# Patient Record
Sex: Female | Born: 1970
Health system: Southern US, Community
[De-identification: ages and names within clinical notes are randomized; demographics above are authoritative.]

## PROBLEM LIST (undated history)

## (undated) DIAGNOSIS — E559 Vitamin D deficiency, unspecified: Secondary | ICD-10-CM

## (undated) DIAGNOSIS — B977 Papillomavirus as the cause of diseases classified elsewhere: Secondary | ICD-10-CM

## (undated) DIAGNOSIS — R87619 Unspecified abnormal cytological findings in specimens from cervix uteri: Secondary | ICD-10-CM

## (undated) DIAGNOSIS — F329 Major depressive disorder, single episode, unspecified: Secondary | ICD-10-CM

## (undated) DIAGNOSIS — E119 Type 2 diabetes mellitus without complications: Secondary | ICD-10-CM

## (undated) DIAGNOSIS — E7800 Pure hypercholesterolemia, unspecified: Secondary | ICD-10-CM

## (undated) DIAGNOSIS — F419 Anxiety disorder, unspecified: Secondary | ICD-10-CM

## (undated) DIAGNOSIS — E039 Hypothyroidism, unspecified: Secondary | ICD-10-CM

## (undated) DIAGNOSIS — F32A Depression, unspecified: Secondary | ICD-10-CM

## (undated) HISTORY — DX: Hypothyroidism, unspecified: E03.9

## (undated) HISTORY — DX: Papillomavirus as the cause of diseases classified elsewhere: B97.7

## (undated) HISTORY — DX: Type 2 diabetes mellitus without complications: E11.9

## (undated) HISTORY — DX: Major depressive disorder, single episode, unspecified: F32.9

## (undated) HISTORY — DX: Unspecified abnormal cytological findings in specimens from cervix uteri: R87.619

## (undated) HISTORY — DX: Vitamin D deficiency, unspecified: E55.9

## (undated) HISTORY — DX: Anxiety disorder, unspecified: F41.9

## (undated) HISTORY — DX: Pure hypercholesterolemia, unspecified: E78.00

## (undated) HISTORY — DX: Depression, unspecified: F32.A

---

## 2003-06-25 HISTORY — PX: COLPOSCOPY: SHX161

## 2013-09-13 ENCOUNTER — Encounter: Payer: Self-pay | Admitting: Gynecology

## 2013-10-08 ENCOUNTER — Encounter: Payer: Self-pay | Admitting: Gynecology

## 2013-10-19 ENCOUNTER — Ambulatory Visit (INDEPENDENT_AMBULATORY_CARE_PROVIDER_SITE_OTHER): Payer: BC Managed Care – PPO | Admitting: Gynecology

## 2013-10-19 ENCOUNTER — Encounter: Payer: Self-pay | Admitting: Gynecology

## 2013-10-19 VITALS — BP 110/70 | HR 76 | Resp 16 | Ht 66.75 in | Wt 147.0 lb

## 2013-10-19 DIAGNOSIS — Z01419 Encounter for gynecological examination (general) (routine) without abnormal findings: Secondary | ICD-10-CM

## 2013-10-19 DIAGNOSIS — F32A Depression, unspecified: Secondary | ICD-10-CM | POA: Insufficient documentation

## 2013-10-19 DIAGNOSIS — E119 Type 2 diabetes mellitus without complications: Secondary | ICD-10-CM | POA: Insufficient documentation

## 2013-10-19 DIAGNOSIS — Z309 Encounter for contraceptive management, unspecified: Secondary | ICD-10-CM

## 2013-10-19 DIAGNOSIS — Z124 Encounter for screening for malignant neoplasm of cervix: Secondary | ICD-10-CM

## 2013-10-19 DIAGNOSIS — Z0000 Encounter for general adult medical examination without abnormal findings: Secondary | ICD-10-CM

## 2013-10-19 DIAGNOSIS — F419 Anxiety disorder, unspecified: Secondary | ICD-10-CM | POA: Insufficient documentation

## 2013-10-19 DIAGNOSIS — F329 Major depressive disorder, single episode, unspecified: Secondary | ICD-10-CM | POA: Insufficient documentation

## 2013-10-19 LAB — POCT URINALYSIS DIPSTICK
BILIRUBIN UA: NEGATIVE
Blood, UA: NEGATIVE
GLUCOSE UA: NEGATIVE
KETONES UA: NEGATIVE
LEUKOCYTES UA: NEGATIVE
NITRITE UA: NEGATIVE
Protein, UA: NEGATIVE
Urobilinogen, UA: NEGATIVE
pH, UA: 5

## 2013-10-19 MED ORDER — NORETHIN ACE-ETH ESTRAD-FE 1-20 MG-MCG PO TABS: 1.0000 | ORAL_TABLET | Freq: Every day | ORAL | Status: DC

## 2013-10-19 NOTE — Patient Instructions (Signed)

## 2013-10-19 NOTE — Progress Notes (Signed)
43 y.o. single Caucasian female   G0P0000 here for annual exam. Pt is currently sexually active. Pt is on generic ovcon 35, reports that cycles are very light, or nonexistent, she is concerned that she doesn't bleed, and fearful re pregnancy.  Pt is type I diabetic, 30y, HbAic 7.1.  Pt does not report issues with yeast infections, denies retinopathy, nephropathy or CVD. No children by choice.  Patient's last menstrual period was 10/05/2013.          Sexually active: yes  The current method of family planning is OCP (estrogen/progesterone).    Exercising: yes  walking & light weights Last pap: 4/14 Alcohol: 6 a week Tobacco: none BSE: not done Mammo: 5/14  Per patient normal    Health Maintenance  Topic Date Due  . Pap Smear  04/12/1989  . Tetanus/tdap  04/12/1990  . Influenza Vaccine  01/22/2014    Family History  Problem Relation Age of Onset  . Thyroid disease Mother   . Stroke Maternal Grandmother     There are no active problems to display for this patient.   Past Medical History  Diagnosis Date  . Abnormal Pap smear of cervix   . Anxiety   . Depression   . Diabetes mellitus without complication   . HPV (human papilloma virus) infection     Past Surgical History  Procedure Laterality Date  . Colposcopy      Allergies: Review of patient's allergies indicates no known allergies.  Current Outpatient Prescriptions  Medication Sig Dispense Refill  . BALZIVA 0.4-35 MG-MCG tablet daily.       Marland Kitchen. LEVEMIR FLEXTOUCH 100 UNIT/ML Pen       . NOVOLOG FLEXPEN 100 UNIT/ML FlexPen       . ONE TOUCH ULTRA TEST test strip       . sertraline (ZOLOFT) 100 MG tablet Takes 200mg  daily      . traZODone (DESYREL) 50 MG tablet as needed.       No current facility-administered medications for this visit.    ROS: Pertinent items are noted in HPI.  Exam:    BP 110/70  Pulse 76  Resp 16  Ht 5' 6.75" (1.695 m)  Wt 147 lb (66.679 kg)  BMI 23.21 kg/m2  LMP 10/05/2013 Weight  change: @WEIGHTCHANGE @ Last 3 height recordings:  Ht Readings from Last 3 Encounters:  10/19/13 5' 6.75" (1.695 m)   General appearance: alert, cooperative and appears stated age Head: Normocephalic, without obvious abnormality, atraumatic Neck: no adenopathy, no carotid bruit, no JVD, supple, symmetrical, trachea midline and thyroid not enlarged, symmetric, no tenderness/mass/nodules Lungs: clear to auscultation bilaterally Breasts: normal appearance, no masses or tenderness Heart: regular rate and rhythm, S1, S2 normal, no murmur, click, rub or gallop Abdomen: soft, non-tender; bowel sounds normal; no masses,  no organomegaly Extremities: extremities normal, atraumatic, no cyanosis or edema Skin: Skin color, texture, turgor normal. No rashes or lesions Lymph nodes: Cervical, supraclavicular, and axillary nodes normal. no inguinal nodes palpated Neurologic: Grossly normal   Pelvic: External genitalia:  normal escutcheon              Urethra: normal appearing urethra with no masses, tenderness or lesions              Bartholins and Skenes: Bartholin's, Urethra, Skene's normal                 Vagina: normal appearing vagina with normal color and discharge, no lesions  Cervix: normal appearance              Pap taken: yes        Bimanual Exam:  Uterus:  uterus is normal size, shape, consistency and nontender                                      Adnexa:    normal adnexa in size, nontender and no masses                                      Rectovaginal: Confirms                                      Anus:  normal sphincter tone, no lesions  1. Routine gynecological examination Well woman mammogram counseled on breast self exam, mammography screening, use and side effects of OCP's, adequate intake of calcium and vitamin D, diet and exercise  2. Laboratory examination ordered as part of a routine general medical examination  - POCT Urinalysis Dipstick  3. Diabetes mellitus  without complication Recommend decreasing estrogen in ocp due to long standing diabetes and overall risk of CVD, pt agreeable  4. Screening for cervical cancer Reviewed change in guidelines and course of HPV infection - Pap Test with HP (IPS)  5. Contraception management loestrin 1/20 #3 x3 Pt aware that amenorrhea on ocp can be normal       return annually or prn   An After Visit Summary was printed and given to the patient.

## 2013-10-21 LAB — IPS PAP TEST WITH HPV

## 2013-10-28 ENCOUNTER — Other Ambulatory Visit: Payer: Self-pay | Admitting: Gynecology

## 2013-10-28 DIAGNOSIS — Z1231 Encounter for screening mammogram for malignant neoplasm of breast: Secondary | ICD-10-CM

## 2013-11-19 ENCOUNTER — Telehealth: Payer: Self-pay | Admitting: Gynecology

## 2013-11-19 NOTE — Telephone Encounter (Signed)
Left msg regarding records are available to pick up. I will hold until 11/26/2013.

## 2013-11-22 ENCOUNTER — Ambulatory Visit (HOSPITAL_COMMUNITY): Payer: BC Managed Care – PPO

## 2013-11-22 ENCOUNTER — Ambulatory Visit (HOSPITAL_COMMUNITY)
Admission: RE | Admit: 2013-11-22 | Discharge: 2013-11-22 | Disposition: A | Discharge status: Home / Self Care | Payer: BC Managed Care – PPO | Source: Ambulatory Visit | Attending: Gynecology | Admitting: Gynecology

## 2013-11-22 DIAGNOSIS — Z1231 Encounter for screening mammogram for malignant neoplasm of breast: Secondary | ICD-10-CM | POA: Insufficient documentation

## 2014-05-24 ENCOUNTER — Telehealth: Payer: Self-pay

## 2014-05-24 NOTE — Telephone Encounter (Signed)
lmtcb to reschedule AEX with Dr. Lathrop 

## 2014-10-05 ENCOUNTER — Other Ambulatory Visit: Payer: Self-pay | Admitting: Certified Nurse Midwife

## 2014-10-05 MED ORDER — NORETHIN ACE-ETH ESTRAD-FE 1-20 MG-MCG PO TABS: 1.0000 | ORAL_TABLET | Freq: Every day | ORAL | Status: DC

## 2014-10-05 NOTE — Telephone Encounter (Signed)
Pt requests birth control refill until appt 10/27/14.  [facility]

## 2014-10-05 NOTE — Telephone Encounter (Signed)
Medication refill request: OCP Last AEX:  10/19/13 TL Next AEX: 10/26/14 DL Last MMG (if hormonal medication request): 11/23/13 BIRADS1:Neg Refill authorized: 10/19/13 #3packs/3R. Today #1pack/0R?

## 2014-10-21 ENCOUNTER — Ambulatory Visit: Payer: BC Managed Care – PPO | Admitting: Gynecology

## 2014-10-26 ENCOUNTER — Ambulatory Visit (INDEPENDENT_AMBULATORY_CARE_PROVIDER_SITE_OTHER): Payer: BLUE CROSS/BLUE SHIELD | Admitting: Certified Nurse Midwife

## 2014-10-26 ENCOUNTER — Encounter: Payer: Self-pay | Admitting: Certified Nurse Midwife

## 2014-10-26 VITALS — BP 104/68 | HR 70 | Resp 20 | Ht 66.5 in | Wt 155.0 lb

## 2014-10-26 DIAGNOSIS — Z01419 Encounter for gynecological examination (general) (routine) without abnormal findings: Secondary | ICD-10-CM

## 2014-10-26 DIAGNOSIS — Z3009 Encounter for other general counseling and advice on contraception: Secondary | ICD-10-CM | POA: Diagnosis not present

## 2014-10-26 MED ORDER — NORETHIN ACE-ETH ESTRAD-FE 1-20 MG-MCG PO TABS: 1.0000 | ORAL_TABLET | Freq: Every day | ORAL | Status: DC

## 2014-10-26 NOTE — Patient Instructions (Signed)

## 2014-10-26 NOTE — Progress Notes (Signed)
44 y.o. G0P0000 Single  Caucasian Fe here for annual exam. Periods normal, no issues. Contraception OCP working well. No STD concerns or screening. Sees Endocrine for Diabetes management, stable at present. No health issues today.  Patient's last menstrual period was 10/03/2014.          Sexually active: Yes.    The current method of family planning is OCP (estrogen/progesterone).    Exercising: Yes.    walking & weights Smoker:  no  Health Maintenance: Pap:  10-19-13 neg HPV HR neg History of abnormal pap with colposcopy with repeat pap smears normal MMG:  11-22-13 category d density,birads 1:neg Colonoscopy:  none BMD:   none TDaP:  09-06-14 Labs: none Self breast exam: done monthly   reports that she has never smoked. She does not have any smokeless tobacco history on file. She reports that she drinks about 1.2 - 1.8 oz of alcohol per week. She reports that she does not use illicit drugs.  Past Medical History  Diagnosis Date  . Abnormal Pap smear of cervix   . Anxiety   . Depression   . Diabetes mellitus without complication   . HPV (human papilloma virus) infection     Past Surgical History  Procedure Laterality Date  . Colposcopy  2005    Current Outpatient Prescriptions  Medication Sig Dispense Refill  . busPIRone (BUSPAR) 30 MG tablet daily.  0  . FLUoxetine (PROZAC) 40 MG capsule Take 80 mg by mouth daily.  0  . LEVEMIR FLEXTOUCH 100 UNIT/ML Pen     . norethindrone-ethinyl estradiol (JUNEL FE,GILDESS FE,LOESTRIN FE) 1-20 MG-MCG tablet Take 1 tablet by mouth daily. 1 Package 0  . NOVOLOG FLEXPEN 100 UNIT/ML FlexPen     . ONE TOUCH ULTRA TEST test strip     . BD PEN NEEDLE NANO U/F 32G X 4 MM MISC   10   No current facility-administered medications for this visit.    Family History  Problem Relation Age of Onset  . Thyroid disease Mother   . Stroke Maternal Grandmother     ROS:  Pertinent items are noted in HPI.  Otherwise, a comprehensive ROS was  negative.  Exam:   BP 104/68 mmHg  Pulse 70  Resp 20  Ht 5' 6.5" (1.689 m)  Wt 155 lb (70.308 kg)  BMI 24.65 kg/m2  LMP 10/03/2014 Height: 5' 6.5" (168.9 cm) Ht Readings from Last 3 Encounters:  10/26/14 5' 6.5" (1.689 m)  10/19/13 5' 6.75" (1.695 m)    General appearance: alert, cooperative and appears stated age Head: Normocephalic, without obvious abnormality, atraumatic Neck: no adenopathy, supple, symmetrical, trachea midline and thyroid normal to inspection and palpation Lungs: clear to auscultation bilaterally Breasts: normal appearance, no masses or tenderness, No nipple retraction or dimpling, No nipple discharge or bleeding, No axillary or supraclavicular adenopathy Heart: regular rate and rhythm Abdomen: soft, non-tender; no masses,  no organomegaly Extremities: extremities normal, atraumatic, no cyanosis or edema Skin: Skin color, texture, turgor normal. No rashes or lesions Lymph nodes: Cervical, supraclavicular, and axillary nodes normal. No abnormal inguinal nodes palpated Neurologic: Grossly normal   Pelvic: External genitalia:  no lesions              Urethra:  normal appearing urethra with no masses, tenderness or lesions              Bartholin's and Skene's: normal                 Vagina: normal  appearing vagina with normal color and discharge, no lesions              Cervix: normal,non tender, no lesions              Pap taken: No. Bimanual Exam:  Uterus:  normal size, contour, position, consistency, mobility, non-tender              Adnexa: normal adnexa and no mass, fullness, tenderness               Rectovaginal: Confirms               Anus:  normal sphincter tone, no lesions  Chaperone present: Yes  A:  Well Woman with normal exam  Contraception OCP desired  Insulin dependant diabetic juvenile onset, stable with Endocrine management.  P:   Reviewed health and wellness pertinent to exam  Rx Junel 1/20 Fe see order  Continue MD follow up as  indicated  Pap smear not taken today   counseled on breast self exam, mammography screening, use and side effects of OCP's, adequate intake of calcium and vitamin D, diet and exercise  return annually or prn  An After Visit Summary was printed and given to the patient.

## 2014-10-28 ENCOUNTER — Other Ambulatory Visit: Payer: Self-pay | Admitting: Certified Nurse Midwife

## 2014-10-28 NOTE — Telephone Encounter (Signed)
Medication refill request: Junel  Last AEX: 10/26/14 DL Next AEX: 6/5/785/9/17 DL Last MMG (if hormonal medication request): 11/23/13 BIRADS!:neg Refill authorized: 10/26/14 #3packs/ 4 Refills to [facility]

## 2014-10-29 NOTE — Progress Notes (Signed)
Reviewed personally.  M. Suzanne Lysander Calixte, MD.  

## 2014-10-31 ENCOUNTER — Other Ambulatory Visit: Payer: Self-pay | Admitting: Certified Nurse Midwife

## 2014-10-31 DIAGNOSIS — Z1231 Encounter for screening mammogram for malignant neoplasm of breast: Secondary | ICD-10-CM

## 2014-11-30 ENCOUNTER — Ambulatory Visit (HOSPITAL_COMMUNITY): Payer: Self-pay

## 2014-12-06 ENCOUNTER — Ambulatory Visit (HOSPITAL_COMMUNITY)
Admission: RE | Admit: 2014-12-06 | Discharge: 2014-12-06 | Disposition: A | Discharge status: Home / Self Care | Payer: BLUE CROSS/BLUE SHIELD | Source: Ambulatory Visit | Attending: Certified Nurse Midwife | Admitting: Certified Nurse Midwife

## 2014-12-06 DIAGNOSIS — Z1231 Encounter for screening mammogram for malignant neoplasm of breast: Secondary | ICD-10-CM | POA: Insufficient documentation

## 2015-10-30 ENCOUNTER — Other Ambulatory Visit: Payer: Self-pay

## 2015-10-30 DIAGNOSIS — Z1231 Encounter for screening mammogram for malignant neoplasm of breast: Secondary | ICD-10-CM

## 2015-10-31 ENCOUNTER — Ambulatory Visit (INDEPENDENT_AMBULATORY_CARE_PROVIDER_SITE_OTHER): Payer: BLUE CROSS/BLUE SHIELD | Admitting: Certified Nurse Midwife

## 2015-10-31 ENCOUNTER — Encounter: Payer: Self-pay | Admitting: Certified Nurse Midwife

## 2015-10-31 VITALS — BP 122/78 | HR 72 | Resp 16 | Ht 66.0 in | Wt 159.0 lb

## 2015-10-31 DIAGNOSIS — Z01419 Encounter for gynecological examination (general) (routine) without abnormal findings: Secondary | ICD-10-CM | POA: Diagnosis not present

## 2015-10-31 DIAGNOSIS — Z124 Encounter for screening for malignant neoplasm of cervix: Secondary | ICD-10-CM

## 2015-10-31 DIAGNOSIS — Z3009 Encounter for other general counseling and advice on contraception: Secondary | ICD-10-CM

## 2015-10-31 MED ORDER — NORETHIN ACE-ETH ESTRAD-FE 1-20 MG-MCG PO TABS: 1.0000 | ORAL_TABLET | Freq: Every day | ORAL | Status: DC

## 2015-10-31 NOTE — Patient Instructions (Signed)

## 2015-10-31 NOTE — Progress Notes (Signed)
45 y.o. G0P0000 Single  Caucasian Fe here for annual exam. Periods  Normal, no issues. Contraception working well. Sees Dr. Talmage Nap for Diabetes Type 1 management, all stable, and all labs. Not sure if Vitamin D has been checked but has appt. In 7/17 and will check. No partner change, no STD concerns. No health issues today.    Patient's last menstrual period was 10/16/2015.          Sexually active: Yes.    The current method of family planning is OCP (estrogen/progesterone).    Exercising: Yes.    Walking, weight lifting Smoker:  no  Health Maintenance: Pap:  10/19/13 Neg. HR HPV:neg MMG:  12/06/14 BIRADS:neg Colonoscopy:  Never BMD:   Never TDaP:  09/06/2014  Shingles: Never Pneumonia: Never Hep C and HIV: HIV: Neg Labs: PCP   reports that she has never smoked. She has never used smokeless tobacco. She reports that she drinks about 1.2 - 1.8 oz of alcohol per week. She reports that she does not use illicit drugs.  Past Medical History  Diagnosis Date  . Abnormal Pap smear of cervix   . Anxiety   . Depression   . Diabetes mellitus without complication (HCC)   . HPV (human papilloma virus) infection     Past Surgical History  Procedure Laterality Date  . Colposcopy  2005    Current Outpatient Prescriptions  Medication Sig Dispense Refill  . BD PEN NEEDLE NANO U/F 32G X 4 MM MISC   10  . FLUoxetine (PROZAC) 40 MG capsule Take 80 mg by mouth daily.  0  . LEVEMIR FLEXTOUCH 100 UNIT/ML Pen     . norethindrone-ethinyl estradiol (JUNEL FE,GILDESS FE,LOESTRIN FE) 1-20 MG-MCG tablet Take 1 tablet by mouth daily. 3 Package 4  . NOVOLOG FLEXPEN 100 UNIT/ML FlexPen     . ONE TOUCH ULTRA TEST test strip     . prazosin (MINIPRESS) 1 MG capsule Take 3 capsules by mouth daily.  1  . busPIRone (BUSPAR) 30 MG tablet daily. Reported on 10/31/2015  0   No current facility-administered medications for this visit.    Family History  Problem Relation Age of Onset  . Thyroid disease Mother   .  Stroke Maternal Grandmother     ROS:  Pertinent items are noted in HPI.  Otherwise, a comprehensive ROS was negative.  Exam:   BP 122/78 mmHg  Pulse 72  Resp 16  Ht  (1.676 m)  Wt 159 lb (72.122 kg)  BMI 25.68 kg/m2  LMP 10/16/2015 Height:  (167.6 cm) Ht Readings from Last 3 Encounters:  10/31/15  (1.676 m)  10/26/14 5' 6.5" (1.689 m)  10/19/13 5' 6.75" (1.695 m)    General appearance: alert, cooperative and appears stated age Head: Normocephalic, without obvious abnormality, atraumatic Neck: no adenopathy, supple, symmetrical, trachea midline and thyroid normal to inspection and palpation Lungs: clear to auscultation bilaterally Breasts: normal appearance, no masses or tenderness, No nipple retraction or dimpling, No nipple discharge or bleeding, No axillary or supraclavicular adenopathy Heart: regular rate and rhythm Abdomen: soft, non-tender; no masses,  no organomegaly Extremities: extremities normal, atraumatic, no cyanosis or edema Skin: Skin color, texture, turgor normal. No rashes or lesions Lymph nodes: Cervical, supraclavicular, and axillary nodes normal. No abnormal inguinal nodes palpated Neurologic: Grossly normal   Pelvic: External genitalia:  no lesions              Urethra:  normal appearing urethra with no masses, tenderness or  lesions              Bartholin's and Skene's: normal                 Vagina: normal appearing vagina with normal color and discharge, no lesions              Cervix: no cervical motion tenderness, no lesions and normal              Pap taken: Yes.   Bimanual Exam:  Uterus:  normal size, contour, position, consistency, mobility, non-tender              Adnexa: normal adnexa and no mass, fullness, tenderness               Rectovaginal: Confirms               Anus:  normal sphincter tone, no lesions  Chaperone present: yes  A:  Well Woman with normal exam  Contraception OCP desired  Type 1 Diabetes with Endocrine  management  P:   Reviewed health and wellness pertinent to exam  Rx Junel 1/20 Fe see order with instructions  Continue follow up with MD as indicated  Pap smear as above with HPVHR   counseled on breast self exam, mammography screening, use and side effects of OCP's, adequate intake of calcium and vitamin D, diet and exercise  return annually or prn  An After Visit Summary was printed and given to the patient.

## 2015-11-01 NOTE — Progress Notes (Signed)
Reviewed personally.  M. Suzanne Jiovanny Burdell, MD.  

## 2015-11-06 LAB — IPS PAP TEST WITH HPV

## 2015-12-07 ENCOUNTER — Ambulatory Visit: Payer: BLUE CROSS/BLUE SHIELD

## 2015-12-07 ENCOUNTER — Ambulatory Visit
Admission: RE | Admit: 2015-12-07 | Discharge: 2015-12-07 | Disposition: A | Discharge status: Home / Self Care | Payer: BLUE CROSS/BLUE SHIELD | Source: Ambulatory Visit

## 2015-12-07 ENCOUNTER — Other Ambulatory Visit: Payer: Self-pay | Admitting: Certified Nurse Midwife

## 2015-12-07 DIAGNOSIS — Z1231 Encounter for screening mammogram for malignant neoplasm of breast: Secondary | ICD-10-CM

## 2015-12-13 ENCOUNTER — Other Ambulatory Visit: Payer: Self-pay | Admitting: Certified Nurse Midwife

## 2015-12-13 NOTE — Telephone Encounter (Signed)
She has on file with med list. She needs to check with pharmacy

## 2015-12-13 NOTE — Telephone Encounter (Signed)
Medication refill request: JUNEL FE 1/20 1-20 MG-MCG Last AEX:  10/31/15 DL Next AEX: 1/61/095/10/18  Last MMG (if hormonal medication request): 12/06/14 BIRADS1 negative, see EPIC Refill authorized: 10/31/15 #3packs w/4 refills; today please advise

## 2015-12-14 NOTE — Telephone Encounter (Signed)
Tried calling patient to verify that she has refills, no answer and patient does not have VM set up on phone

## 2015-12-14 NOTE — Telephone Encounter (Signed)
Patient will check with pharmacy to ensure that refills are there. Closing this message

## 2016-02-29 DIAGNOSIS — E109 Type 1 diabetes mellitus without complications: Secondary | ICD-10-CM | POA: Diagnosis not present

## 2016-03-07 DIAGNOSIS — E109 Type 1 diabetes mellitus without complications: Secondary | ICD-10-CM | POA: Diagnosis not present

## 2016-06-20 DIAGNOSIS — F422 Mixed obsessional thoughts and acts: Secondary | ICD-10-CM | POA: Diagnosis not present

## 2016-09-04 DIAGNOSIS — E109 Type 1 diabetes mellitus without complications: Secondary | ICD-10-CM | POA: Diagnosis not present

## 2016-09-24 ENCOUNTER — Telehealth: Payer: Self-pay | Admitting: Certified Nurse Midwife

## 2016-09-24 DIAGNOSIS — M76821 Posterior tibial tendinitis, right leg: Secondary | ICD-10-CM | POA: Diagnosis not present

## 2016-09-24 DIAGNOSIS — M7731 Calcaneal spur, right foot: Secondary | ICD-10-CM | POA: Diagnosis not present

## 2016-09-24 DIAGNOSIS — M722 Plantar fascial fibromatosis: Secondary | ICD-10-CM | POA: Diagnosis not present

## 2016-09-24 DIAGNOSIS — M71571 Other bursitis, not elsewhere classified, right ankle and foot: Secondary | ICD-10-CM | POA: Diagnosis not present

## 2016-09-24 NOTE — Telephone Encounter (Signed)
Message left on voicemail to reschedule cancelled appointment. °

## 2016-10-07 DIAGNOSIS — M71571 Other bursitis, not elsewhere classified, right ankle and foot: Secondary | ICD-10-CM | POA: Diagnosis not present

## 2016-10-07 DIAGNOSIS — M722 Plantar fascial fibromatosis: Secondary | ICD-10-CM | POA: Diagnosis not present

## 2016-10-09 DIAGNOSIS — J40 Bronchitis, not specified as acute or chronic: Secondary | ICD-10-CM | POA: Diagnosis not present

## 2016-10-09 DIAGNOSIS — J029 Acute pharyngitis, unspecified: Secondary | ICD-10-CM | POA: Diagnosis not present

## 2016-10-09 DIAGNOSIS — R0602 Shortness of breath: Secondary | ICD-10-CM | POA: Diagnosis not present

## 2016-10-28 DIAGNOSIS — H5213 Myopia, bilateral: Secondary | ICD-10-CM | POA: Diagnosis not present

## 2016-10-29 ENCOUNTER — Other Ambulatory Visit: Payer: Self-pay | Admitting: Certified Nurse Midwife

## 2016-10-29 DIAGNOSIS — Z1231 Encounter for screening mammogram for malignant neoplasm of breast: Secondary | ICD-10-CM

## 2016-10-31 ENCOUNTER — Ambulatory Visit: Payer: BLUE CROSS/BLUE SHIELD | Admitting: Certified Nurse Midwife

## 2016-11-05 ENCOUNTER — Encounter: Payer: Self-pay | Admitting: Certified Nurse Midwife

## 2016-11-05 ENCOUNTER — Ambulatory Visit (INDEPENDENT_AMBULATORY_CARE_PROVIDER_SITE_OTHER): Payer: BLUE CROSS/BLUE SHIELD | Admitting: Certified Nurse Midwife

## 2016-11-05 VITALS — BP 110/76 | HR 68 | Resp 16 | Ht 66.5 in | Wt 163.0 lb

## 2016-11-05 DIAGNOSIS — Z01419 Encounter for gynecological examination (general) (routine) without abnormal findings: Secondary | ICD-10-CM | POA: Diagnosis not present

## 2016-11-05 DIAGNOSIS — Z3041 Encounter for surveillance of contraceptive pills: Secondary | ICD-10-CM | POA: Diagnosis not present

## 2016-11-05 DIAGNOSIS — B373 Candidiasis of vulva and vagina: Secondary | ICD-10-CM | POA: Diagnosis not present

## 2016-11-05 DIAGNOSIS — B3731 Acute candidiasis of vulva and vagina: Secondary | ICD-10-CM

## 2016-11-05 DIAGNOSIS — Z3009 Encounter for other general counseling and advice on contraception: Secondary | ICD-10-CM

## 2016-11-05 MED ORDER — NORETHIN ACE-ETH ESTRAD-FE 1-20 MG-MCG PO TABS
1.0000 | ORAL_TABLET | Freq: Every day | ORAL | 4 refills | Status: DC
Start: 2016-11-05 — End: 2017-11-06

## 2016-11-05 MED ORDER — NYSTATIN-TRIAMCINOLONE 100000-0.1 UNIT/GM-% EX OINT: 1.0000 "application " | TOPICAL_OINTMENT | Freq: Two times a day (BID) | CUTANEOUS | 0 refills | Status: DC

## 2016-11-05 NOTE — Progress Notes (Signed)
46 y.o. G0P0000 Single  Caucasian Fe here for annual exam. Periods normal, lighter other wise normal. Contraception working well. No partner change or STD screening. Sees Dr. Talmage Nap for diabetes management and labs every 6 months. Stopped Prozac and feels much better now. Last Hgb A1-c was 7.1, working on tighter control. Was treated for sinus infection with PCP recently and now has some external vulva irritation and itching. Has OTC but not resolving. No other health issues today. Planning [place] trip!  Patient's last menstrual period was 10/07/2016.          Sexually active: Yes.    The current method of family planning is OCP (estrogen/progesterone).    Exercising: Yes.    walking & weight lifting Smoker:  no  Health Maintenance: Pap:  10-19-13 neg HPV HR neg, 10-31-15 neg HPV HR neg History of Abnormal Pap: yes with colposcopy MMG:  12-07-15 category d density birads 1:neg Self Breast exams: no Colonoscopy:  none BMD:   none TDaP:  2016 Shingles: no Pneumonia: no Hep C and HIV: HIV neg 2012 Labs: none   reports that she has never smoked. She has never used smokeless tobacco. She reports that she drinks about 1.2 - 1.8 oz of alcohol per week . She reports that she does not use drugs.  Past Medical History:  Diagnosis Date  . Abnormal Pap smear of cervix   . Anxiety   . Depression   . Diabetes mellitus without complication (HCC)   . HPV (human papilloma virus) infection     Past Surgical History:  Procedure Laterality Date  . COLPOSCOPY  2005    Current Outpatient Prescriptions  Medication Sig Dispense Refill  . BD PEN NEEDLE NANO U/F 32G X 4 MM MISC   10  . LEVEMIR FLEXTOUCH 100 UNIT/ML Pen     . norethindrone-ethinyl estradiol (JUNEL FE,GILDESS FE,LOESTRIN FE) 1-20 MG-MCG tablet Take 1 tablet by mouth daily. 3 Package 4  . NOVOLOG FLEXPEN 100 UNIT/ML FlexPen     . ONE TOUCH ULTRA TEST test strip      No current facility-administered medications for this visit.      Family History  Problem Relation Age of Onset  . Thyroid disease Mother   . Stroke Maternal Grandmother     ROS:  Pertinent items are noted in HPI.  Otherwise, a comprehensive ROS was negative.  Exam:   BP 110/76   Pulse 68   Resp 16   Ht 5' 6.5" (1.689 m)   Wt 163 lb (73.9 kg)   LMP 10/07/2016   BMI 25.91 kg/m  Height: 5' 6.5" (168.9 cm) Ht Readings from Last 3 Encounters:  11/05/16 5' 6.5" (1.689 m)  10/31/15 5\' 6"  (1.676 m)  10/26/14 5' 6.5" (1.689 m)    General appearance: alert, cooperative and appears stated age Head: Normocephalic, without obvious abnormality, atraumatic Neck: no adenopathy, supple, symmetrical, trachea midline and thyroid normal to inspection and palpation Lungs: clear to auscultation bilaterally Breasts: normal appearance, no masses or tenderness, No nipple retraction or dimpling, No nipple discharge or bleeding, No axillary or supraclavicular adenopathy Heart: regular rate and rhythm Abdomen: soft, non-tender; no masses,  no organomegaly Extremities: extremities normal, atraumatic, no cyanosis or edema Skin: Skin color, texture, turgor normal. No rashes or lesions Lymph nodes: Cervical, supraclavicular, and axillary nodes normal. No abnormal inguinal nodes palpated Neurologic: Grossly normal   Pelvic: External genitalia:  no lesions, but redness noted bilateral on vulva with exudate and scaling, wet prep taken  Urethra:  normal appearing urethra with no masses, tenderness or lesions              Bartholin's and Skene's: normal                 Vagina: normal appearing vagina with normal color and discharge, no lesions scant blood noted(period onset)              Cervix: no cervical motion tenderness, no lesions and normal appearance              Pap taken: No. Bimanual Exam:  Uterus:  normal size, contour, position, consistency, mobility, non-tender and anteverted              Adnexa: normal adnexa and no mass, fullness,  tenderness               Rectovaginal: Confirms               Anus:  normal sphincter tone, no lesions Wet Prep: Koh , Saline + yeast noted  Chaperone present: yes  A:  Well Woman with normal exam  Contraception OCP desired  Yeast vulvitis  Type Diabetes with Endocrine management good control    P:   Reviewed health and wellness pertinent to exam  Rx Junel Fe 1/20 see order with instructions  Discussed yeast finding and not unusual after antibiotic use. Discussed Aveeno sitz bath for comfort.  Rx Triamcinolone /Nystatin ointment see order with instructions  Pap smear: no   counseled on breast self exam, mammography screening, STD prevention, HIV risk factors and prevention, use and side effects of OCP's, adequate intake of calcium and vitamin D, diet and exercise  return annually or prn  An After Visit Summary was printed and given to the patient.

## 2016-11-05 NOTE — Patient Instructions (Signed)
EXERCISE AND DIET:  We recommended that you start or continue a regular exercise program for good health. Regular exercise means any activity that makes your heart beat faster and makes you sweat.  We recommend exercising at least 30 minutes per day at least 3 days a week, preferably 4 or 5.  We also recommend a diet low in fat and sugar.  Inactivity, poor dietary choices and obesity can cause diabetes, heart attack, stroke, and kidney damage, among others.    ALCOHOL AND SMOKING:  Women should limit their alcohol intake to no more than 7 drinks/beers/glasses of wine (combined, not each!) per week. Moderation of alcohol intake to this level decreases your risk of breast cancer and liver damage. And of course, no recreational drugs are part of a healthy lifestyle.  And absolutely no smoking or even second hand smoke. Most people know smoking can cause heart and lung diseases, but did you know it also contributes to weakening of your bones? Aging of your skin?  Yellowing of your teeth and nails?  CALCIUM AND VITAMIN D:  Adequate intake of calcium and Vitamin D are recommended.  The recommendations for exact amounts of these supplements seem to change often, but generally speaking 600 mg of calcium (either carbonate or citrate) and 800 units of Vitamin D per day seems prudent. Certain women may benefit from higher intake of Vitamin D.  If you are among these women, your doctor will have told you during your visit.    PAP SMEARS:  Pap smears, to check for cervical cancer or precancers,  have traditionally been done yearly, although recent scientific advances have shown that most women can have pap smears less often.  However, every woman still should have a physical exam from her gynecologist every year. It will include a breast check, inspection of the vulva and vagina to check for abnormal growths or skin changes, a visual exam of the cervix, and then an exam to evaluate the size and shape of the uterus and  ovaries.  And after 46 years of age, a rectal exam is indicated to check for rectal cancers. We will also provide age appropriate advice regarding health maintenance, like when you should have certain vaccines, screening for sexually transmitted diseases, bone density testing, colonoscopy, mammograms, etc.   MAMMOGRAMS:  All women over 40 years old should have a yearly mammogram. Many facilities now offer a "3D" mammogram, which may cost around $50 extra out of pocket. If possible,  we recommend you accept the option to have the 3D mammogram performed.  It both reduces the number of women who will be called back for extra views which then turn out to be normal, and it is better than the routine mammogram at detecting truly abnormal areas.    COLONOSCOPY:  Colonoscopy to screen for colon cancer is recommended for all women at age 50.  We know, you hate the idea of the prep.  We agree, BUT, having colon cancer and not knowing it is worse!!  Colon cancer so often starts as a polyp that can be seen and removed at colonscopy, which can quite literally save your life!  And if your first colonoscopy is normal and you have no family history of colon cancer, most women don't have to have it again for 10 years.  Once every ten years, you can do something that may end up saving your life, right?  We will be happy to help you get it scheduled when you are ready.    Be sure to check your insurance coverage so you understand how much it will cost.  It may be covered as a preventative service at no cost, but you should check your particular policy.      Vaginal Yeast infection, Adult Vaginal yeast infection is a condition that causes soreness, swelling, and redness (inflammation) of the vagina. It also causes vaginal discharge. This is a common condition. Some women get this infection frequently. What are the causes? This condition is caused by a change in the normal balance of the yeast (candida) and bacteria that live  in the vagina. This change causes an overgrowth of yeast, which causes the inflammation. What increases the risk? This condition is more likely to develop in:  Women who take antibiotic medicines.  Women who have diabetes.  Women who take birth control pills.  Women who are pregnant.  Women who douche often.  Women who have a weak defense (immune) system.  Women who have been taking steroid medicines for a long time.  Women who frequently wear tight clothing. What are the signs or symptoms? Symptoms of this condition include:  White, thick vaginal discharge.  Swelling, itching, redness, and irritation of the vagina. The lips of the vagina (vulva) may be affected as well.  Pain or a burning feeling while urinating.  Pain during sex. How is this diagnosed? This condition is diagnosed with a medical history and physical exam. This will include a pelvic exam. Your health care provider will examine a sample of your vaginal discharge under a microscope. Your health care provider may send this sample for testing to confirm the diagnosis. How is this treated? This condition is treated with medicine. Medicines may be over-the-counter or prescription. You may be told to use one or more of the following:  Medicine that is taken orally.  Medicine that is applied as a cream.  Medicine that is inserted directly into the vagina (suppository). Follow these instructions at home:  Take or apply over-the-counter and prescription medicines only as told by your health care provider.  Do not have sex until your health care provider has approved. Tell your sex partner that you have a yeast infection. That person should go to his or her health care provider if he or she develops symptoms.  Do not wear tight clothes, such as pantyhose or tight pants.  Avoid using tampons until your health care provider approves.  Eat more yogurt. This may help to keep your yeast infection from  returning.  Try taking a sitz bath to help with discomfort. This is a warm water bath that is taken while you are sitting down. The water should only come up to your hips and should cover your buttocks. Do this 3-4 times per day or as told by your health care provider.  Do not douche.  Wear breathable, cotton underwear.  If you have diabetes, keep your blood sugar levels under control. Contact a health care provider if:  You have a fever.  Your symptoms go away and then return.  Your symptoms do not get better with treatment.  Your symptoms get worse.  You have new symptoms.  You develop blisters in or around your vagina.  You have blood coming from your vagina and it is not your menstrual period.  You develop pain in your abdomen. This information is not intended to replace advice given to you by your health care provider. Make sure you discuss any questions you have with your health care provider. Document   Released: 03/20/2005 Document Revised: 11/22/2015 Document Reviewed: 12/12/2014 Elsevier Interactive Patient Education  2017 [employer]   

## 2016-11-20 ENCOUNTER — Other Ambulatory Visit: Payer: Self-pay | Admitting: Certified Nurse Midwife

## 2016-11-20 DIAGNOSIS — Z3009 Encounter for other general counseling and advice on contraception: Secondary | ICD-10-CM

## 2016-12-09 ENCOUNTER — Ambulatory Visit
Admission: RE | Admit: 2016-12-09 | Discharge: 2016-12-09 | Disposition: A | Discharge status: Home / Self Care | Payer: BLUE CROSS/BLUE SHIELD | Source: Ambulatory Visit | Attending: Certified Nurse Midwife | Admitting: Certified Nurse Midwife

## 2016-12-09 DIAGNOSIS — Z1231 Encounter for screening mammogram for malignant neoplasm of breast: Secondary | ICD-10-CM | POA: Diagnosis not present

## 2017-03-26 DIAGNOSIS — E109 Type 1 diabetes mellitus without complications: Secondary | ICD-10-CM | POA: Diagnosis not present

## 2017-04-01 DIAGNOSIS — E109 Type 1 diabetes mellitus without complications: Secondary | ICD-10-CM | POA: Diagnosis not present

## 2017-04-01 DIAGNOSIS — E7800 Pure hypercholesterolemia, unspecified: Secondary | ICD-10-CM | POA: Diagnosis not present

## 2017-07-22 DIAGNOSIS — E109 Type 1 diabetes mellitus without complications: Secondary | ICD-10-CM | POA: Diagnosis not present

## 2017-07-22 DIAGNOSIS — E7800 Pure hypercholesterolemia, unspecified: Secondary | ICD-10-CM | POA: Diagnosis not present

## 2017-10-30 ENCOUNTER — Other Ambulatory Visit: Payer: Self-pay | Admitting: Certified Nurse Midwife

## 2017-10-30 DIAGNOSIS — Z1231 Encounter for screening mammogram for malignant neoplasm of breast: Secondary | ICD-10-CM

## 2017-11-06 ENCOUNTER — Other Ambulatory Visit: Payer: Self-pay

## 2017-11-06 ENCOUNTER — Encounter: Payer: Self-pay | Admitting: Certified Nurse Midwife

## 2017-11-06 ENCOUNTER — Ambulatory Visit (INDEPENDENT_AMBULATORY_CARE_PROVIDER_SITE_OTHER): Payer: BLUE CROSS/BLUE SHIELD | Admitting: Certified Nurse Midwife

## 2017-11-06 VITALS — BP 120/80 | HR 64 | Resp 16 | Ht 66.25 in | Wt 167.0 lb

## 2017-11-06 DIAGNOSIS — B3731 Acute candidiasis of vulva and vagina: Secondary | ICD-10-CM

## 2017-11-06 DIAGNOSIS — B373 Candidiasis of vulva and vagina: Secondary | ICD-10-CM

## 2017-11-06 DIAGNOSIS — Z3041 Encounter for surveillance of contraceptive pills: Secondary | ICD-10-CM

## 2017-11-06 DIAGNOSIS — Z1211 Encounter for screening for malignant neoplasm of colon: Secondary | ICD-10-CM

## 2017-11-06 DIAGNOSIS — Z01419 Encounter for gynecological examination (general) (routine) without abnormal findings: Secondary | ICD-10-CM | POA: Diagnosis not present

## 2017-11-06 MED ORDER — NORETHIN ACE-ETH ESTRAD-FE 1-20 MG-MCG PO TABS: 1.0000 | ORAL_TABLET | Freq: Every day | ORAL | 4 refills | Status: DC

## 2017-11-06 MED ORDER — NYSTATIN-TRIAMCINOLONE 100000-0.1 UNIT/GM-% EX OINT: 1.0000 "application " | TOPICAL_OINTMENT | Freq: Two times a day (BID) | CUTANEOUS | 2 refills | Status: DC

## 2017-11-06 NOTE — Progress Notes (Signed)
47 y.o. G0P0000 Single  Caucasian Fe here for annual exam. Periods normal, no issues. Contraception working well for cycle control only now.Not sexually active since last exam. Sees Dr. Talmage Nap for glucose control, more stable now, has all labs there and management. Maintaining  weight now more effectively now.Walks and does light weights. Occasional alcohol use only socially. No problems today.  Patient's last menstrual period was 10/30/2017 (exact date).          Sexually active: No.  The current method of family planning is OCP (estrogen/progesterone).    Exercising: Yes.    walking, light weights Smoker:  no  Health Maintenance: Pap:  10-31-15 neg HPV HR neg History of Abnormal Pap: yes MMG:  12-09-16 category c density birads 1:neg Self Breast exams: no Colonoscopy:  none BMD:   none TDaP:  2016 Shingles: no Pneumonia: no Hep C and HIV: HIV neg 2013 Labs: with MD   reports that she has never smoked. She has never used smokeless tobacco. She reports that she drinks alcohol. She reports that she does not use drugs.  Past Medical History:  Diagnosis Date  . Abnormal Pap smear of cervix   . Anxiety   . Depression   . Diabetes mellitus without complication (HCC)   . HPV (human papilloma virus) infection     Past Surgical History:  Procedure Laterality Date  . COLPOSCOPY  2005    Current Outpatient Medications  Medication Sig Dispense Refill  . B-D UF III MINI PEN NEEDLES 31G X 5 MM MISC USE AS DIRECTED 5 TIMES A DAY  11  . HUMALOG KWIKPEN 100 UNIT/ML KiwkPen   1  . LANTUS SOLOSTAR 100 UNIT/ML Solostar Pen   1  . norethindrone-ethinyl estradiol (JUNEL FE,GILDESS FE,LOESTRIN FE) 1-20 MG-MCG tablet Take 1 tablet by mouth daily. 3 Package 4  . ONE TOUCH ULTRA TEST test strip      No current facility-administered medications for this visit.     Family History  Problem Relation Age of Onset  . Thyroid disease Mother   . Stroke Maternal Grandmother   . Breast cancer Neg Hx      ROS:  Pertinent items are noted in HPI.  Otherwise, a comprehensive ROS was negative.  Exam:   BP 120/80   Pulse 64   Resp 16   Ht 5' 6.25" (1.683 m)   Wt 167 lb (75.8 kg)   LMP 10/30/2017 (Exact Date)   BMI 26.75 kg/m  Height: 5' 6.25" (168.3 cm) Ht Readings from Last 3 Encounters:  11/06/17 5' 6.25" (1.683 m)  11/05/16 5' 6.5" (1.689 m)  10/31/15  (1.676 m)    General appearance: alert, cooperative and appears stated age Head: Normocephalic, without obvious abnormality, atraumatic Neck: no adenopathy, supple, symmetrical, trachea midline and thyroid normal to inspection and palpation Lungs: clear to auscultation bilaterally Breasts: normal appearance, no masses or tenderness, No nipple retraction or dimpling, No nipple discharge or bleeding, No axillary or supraclavicular adenopathy Heart: regular rate and rhythm Abdomen: soft, non-tender; no masses,  no organomegaly Extremities: extremities normal, atraumatic, no cyanosis or edema Skin: Skin color, texture, turgor normal. No rashes or lesions Lymph nodes: Cervical, supraclavicular, and axillary nodes normal. No abnormal inguinal nodes palpated Neurologic: Grossly normal   Pelvic: External genitalia:  no lesions              Urethra:  normal appearing urethra with no masses, tenderness or lesions  Bartholin's and Skene's: normal                 Vagina: normal appearing vagina with normal color and discharge, no lesions              Cervix: no cervical motion tenderness, no lesions and normal appearance              Pap taken: No. Bimanual Exam:  Uterus:  normal size, contour, position, consistency, mobility, non-tender and anteverted              Adnexa: normal adnexa and no mass, fullness, tenderness               Rectovaginal: Confirms               Anus:  normal sphincter tone, no lesions  Chaperone present: yes  A:  Well Woman with normal exam  Contraception working well for cycle control now  desires continuance  MD management of glucose, stable at present  IFOB candidate    P:   Reviewed health and wellness pertinent to exam  Risks/benefits/warning signs of OCP reviewed. Patient would like to continue use.  Rx Junel Fe see order with instructions  Continue follow up with MD as indicated.  Discussed recommendation for colon early screening with IFOB and colonoscopy no later than 50. Patient requests. This was given to patient with instructions.  Pap smear: no   counseled on breast self exam, mammography screening, feminine hygiene, use and side effects of OCP's, adequate intake of calcium and vitamin D, diet and exercise  return annually or prn  An After Visit Summary was printed and given to the patient.

## 2017-11-06 NOTE — Patient Instructions (Signed)

## 2017-12-11 ENCOUNTER — Ambulatory Visit
Admission: RE | Admit: 2017-12-11 | Discharge: 2017-12-11 | Disposition: A | Discharge status: Home / Self Care | Payer: BLUE CROSS/BLUE SHIELD | Source: Ambulatory Visit | Attending: Certified Nurse Midwife | Admitting: Certified Nurse Midwife

## 2017-12-11 DIAGNOSIS — Z1231 Encounter for screening mammogram for malignant neoplasm of breast: Secondary | ICD-10-CM | POA: Diagnosis not present

## 2018-01-12 DIAGNOSIS — E7800 Pure hypercholesterolemia, unspecified: Secondary | ICD-10-CM | POA: Diagnosis not present

## 2018-01-12 DIAGNOSIS — E109 Type 1 diabetes mellitus without complications: Secondary | ICD-10-CM | POA: Diagnosis not present

## 2018-01-19 DIAGNOSIS — E109 Type 1 diabetes mellitus without complications: Secondary | ICD-10-CM | POA: Diagnosis not present

## 2018-01-19 DIAGNOSIS — R635 Abnormal weight gain: Secondary | ICD-10-CM | POA: Diagnosis not present

## 2018-01-19 DIAGNOSIS — E7800 Pure hypercholesterolemia, unspecified: Secondary | ICD-10-CM | POA: Diagnosis not present

## 2018-03-17 DIAGNOSIS — E039 Hypothyroidism, unspecified: Secondary | ICD-10-CM | POA: Diagnosis not present

## 2018-07-28 DIAGNOSIS — E109 Type 1 diabetes mellitus without complications: Secondary | ICD-10-CM | POA: Diagnosis not present

## 2018-07-28 DIAGNOSIS — E7800 Pure hypercholesterolemia, unspecified: Secondary | ICD-10-CM | POA: Diagnosis not present

## 2018-07-28 DIAGNOSIS — E039 Hypothyroidism, unspecified: Secondary | ICD-10-CM | POA: Diagnosis not present

## 2018-10-26 ENCOUNTER — Other Ambulatory Visit: Payer: Self-pay | Admitting: Certified Nurse Midwife

## 2018-10-26 DIAGNOSIS — Z1231 Encounter for screening mammogram for malignant neoplasm of breast: Secondary | ICD-10-CM

## 2018-11-18 ENCOUNTER — Ambulatory Visit: Payer: BLUE CROSS/BLUE SHIELD | Admitting: Certified Nurse Midwife

## 2018-12-14 ENCOUNTER — Ambulatory Visit: Payer: BLUE CROSS/BLUE SHIELD

## 2018-12-16 ENCOUNTER — Ambulatory Visit
Admission: RE | Admit: 2018-12-16 | Discharge: 2018-12-16 | Disposition: A | Discharge status: Home / Self Care | Payer: BC Managed Care – PPO | Source: Ambulatory Visit | Attending: Certified Nurse Midwife | Admitting: Certified Nurse Midwife

## 2018-12-16 ENCOUNTER — Other Ambulatory Visit: Payer: Self-pay

## 2018-12-16 DIAGNOSIS — Z1231 Encounter for screening mammogram for malignant neoplasm of breast: Secondary | ICD-10-CM | POA: Diagnosis not present

## 2018-12-21 ENCOUNTER — Ambulatory Visit: Payer: BLUE CROSS/BLUE SHIELD | Admitting: Certified Nurse Midwife

## 2019-01-05 ENCOUNTER — Ambulatory Visit: Payer: BLUE CROSS/BLUE SHIELD | Admitting: Certified Nurse Midwife

## 2019-01-19 ENCOUNTER — Other Ambulatory Visit: Payer: Self-pay | Admitting: Certified Nurse Midwife

## 2019-01-19 DIAGNOSIS — Z3041 Encounter for surveillance of contraceptive pills: Secondary | ICD-10-CM

## 2019-01-19 NOTE — Telephone Encounter (Signed)
Medication refill request: junel  Last AEX:  11/06/17 Next AEX: 01/26/19 Last MMG (if hormonal medication request): 12/16/18 Bi-rads 1 Neg  Refill authorized: #[id number] with 0 RF to get her to her appointment.

## 2019-01-20 DIAGNOSIS — E119 Type 2 diabetes mellitus without complications: Secondary | ICD-10-CM | POA: Diagnosis not present

## 2019-01-26 ENCOUNTER — Other Ambulatory Visit (HOSPITAL_COMMUNITY)
Admission: RE | Admit: 2019-01-26 | Discharge: 2019-01-26 | Disposition: A | Discharge status: Home / Self Care | Payer: BC Managed Care – PPO | Source: Ambulatory Visit | Attending: Certified Nurse Midwife | Admitting: Certified Nurse Midwife

## 2019-01-26 ENCOUNTER — Ambulatory Visit (INDEPENDENT_AMBULATORY_CARE_PROVIDER_SITE_OTHER): Payer: BC Managed Care – PPO | Admitting: Certified Nurse Midwife

## 2019-01-26 ENCOUNTER — Other Ambulatory Visit: Payer: Self-pay

## 2019-01-26 ENCOUNTER — Encounter: Payer: Self-pay | Admitting: Certified Nurse Midwife

## 2019-01-26 VITALS — BP 120/80 | HR 68 | Temp 97.2°F | Resp 16 | Ht 66.5 in | Wt 178.0 lb

## 2019-01-26 DIAGNOSIS — Z124 Encounter for screening for malignant neoplasm of cervix: Secondary | ICD-10-CM

## 2019-01-26 DIAGNOSIS — Z01419 Encounter for gynecological examination (general) (routine) without abnormal findings: Secondary | ICD-10-CM

## 2019-01-26 DIAGNOSIS — R5383 Other fatigue: Secondary | ICD-10-CM | POA: Diagnosis not present

## 2019-01-26 NOTE — Progress Notes (Signed)
48 y.o. G0P0000 Single  Caucasian Fe here for annual exam. Periods normal, no issues. Continues with OCP for contraception , working well. Has gained approximately 11 pounds. Not sexually active over the past year. Sees Dr. Chalmers Cater for glucose management which was stable, and was determined to be hypothyroid, now on medication,has follow up with labs in one week.. Feels her energy level is low and sleep patterns are not good. Does exercise,but mainly at night and has stress at work. Has been working on relaxation and using Chamomile tea, with some change. No other health issues today.  Patient's last menstrual period was 01/18/2019 (exact date).          Sexually active: No.  The current method of family planning is OCP (estrogen/progesterone).    Exercising: Yes.    low impact aerobics Smoker:  no  Review of Systems  Constitutional:       Menopausal changes  HENT: Negative.   Eyes: Negative.   Respiratory: Negative.   Cardiovascular: Negative.   Gastrointestinal: Negative.   Genitourinary: Negative.   Musculoskeletal: Negative.   Skin: Negative.   Neurological: Negative.   Endo/Heme/Allergies: Negative.   Psychiatric/Behavioral: Negative.     Health Maintenance: Pap:  10-31-15 neg HPV HR neg History of Abnormal Pap: yes MMG:  12-16-2018 category c density birads 1:neg Self Breast exams: occ Colonoscopy:  none BMD:   none TDaP:  2016 Shingles: no Pneumonia: no Hep C and HIV: HIV neg 2013 Labs: yes if needed   reports that she has never smoked. She has never used smokeless tobacco. She reports current alcohol use. She reports that she does not use drugs.  Past Medical History:  Diagnosis Date  . Abnormal Pap smear of cervix   . Anxiety   . Depression   . Diabetes mellitus without complication ([place])   . HPV (human papilloma virus) infection     Past Surgical History:  Procedure Laterality Date  . COLPOSCOPY  2005    Current Outpatient Medications  Medication Sig  Dispense Refill  . B-D UF III MINI PEN NEEDLES 31G X 5 MM MISC USE AS DIRECTED 5 TIMES A DAY  11  . HUMALOG KWIKPEN 100 UNIT/ML KiwkPen   1  . JUNEL FE 1/20 1-20 MG-MCG tablet TAKE 1 TABLET BY MOUTH EVERY DAY 28 tablet 0  . LANTUS SOLOSTAR 100 UNIT/ML Solostar Pen   1  . levothyroxine (SYNTHROID) 25 MCG tablet Take 25 mcg by mouth daily.    . mupirocin ointment (BACTROBAN) 2 % Apply topically.    . nystatin-triamcinolone ointment (MYCOLOG) Apply 1 application topically 2 (two) times daily. Prn for external irritation, do not use more than 5 days consistent 30 g 2  . ONE TOUCH ULTRA TEST test strip      No current facility-administered medications for this visit.     Family History  Problem Relation Age of Onset  . Thyroid disease Mother   . Stroke Maternal Grandmother   . Breast cancer Neg Hx     ROS:  Pertinent items are noted in HPI.  Otherwise, a comprehensive ROS was negative.  Exam:   BP 120/80   Pulse 68   Temp (!) 97.2 F (36.2 C) (Skin)   Resp 16   Ht 5' 6.5" (1.689 m)   Wt 178 lb (80.7 kg)   LMP 01/18/2019 (Exact Date)   BMI 28.30 kg/m  Height: 5' 6.5" (168.9 cm) Ht Readings from Last 3 Encounters:  01/26/19 5' 6.5" (1.689 m)  11/06/17 5' 6.25" (1.683 m)  11/05/16 5' 6.5" (1.689 m)    General appearance: alert, cooperative and appears stated age Head: Normocephalic, without obvious abnormality, atraumatic Neck: no adenopathy, supple, symmetrical, trachea midline and thyroid normal to inspection and palpation Lungs: clear to auscultation bilaterally Breasts: normal appearance, no masses or tenderness, No nipple retraction or dimpling, No nipple discharge or bleeding, No axillary or supraclavicular adenopathy Heart: regular rate and rhythm Abdomen: soft, non-tender; no masses,  no organomegaly Extremities: extremities normal, atraumatic, no cyanosis or edema Skin: Skin color, texture, turgor normal. No rashes or lesions Lymph nodes: Cervical, supraclavicular,  and axillary nodes normal. No abnormal inguinal nodes palpated Neurologic: Grossly normal   Pelvic: External genitalia:  no lesions              Urethra:  normal appearing urethra with no masses, tenderness or lesions              Bartholin's and Skene's: normal                 Vagina: normal appearing vagina with normal color and discharge, no lesions              Cervix: no cervical motion tenderness, no lesions and normal appearance              Pap taken: Yes.   Bimanual Exam:  Uterus:  normal size, contour, position, consistency, mobility, non-tender and anteverted              Adnexa: normal adnexa and no mass, fullness, tenderness               Rectovaginal: Confirms               Anus:  normal sphincter tone, no lesions  Chaperone present: yes  A:  Well Woman with normal exam  Contraception OCP, working well  Type 1 diabetic with Endocrine management  Newly diagnosed Hypothyroid, adjusting to medication   Fatigue and weight change  Screening labs  P:   Reviewed health and wellness pertinent to exam.   Risks/benefits/warning signs of OCP reviewed, desires continuance for cycle control  Continue follow up with Endocrine regarding medication management.  Discussed hypothyroid can present as fatigue, heat/cold intolerance which can mimic perimenopausal changes also. Given Information booklet to help with information. Discussed also she is still on OCP which helps with changes. Stressed importance of good diet, exercise and sleep pattern.  Discussed colon screening with colonoscopy, cologard and IFOB. Patient would like to consider colonoscopy and will call when ready to schedule. Will need referral to Dr. Loreta AveMann at that time.  Screening labs: CBC, Vitamin D  Pap smear: yes   counseled on breast self exam, mammography screening, feminine hygiene, use and side effects of OCP's, menopause, adequate intake of calcium and vitamin D, diet and exercise  return annually or prn  An  After Visit Summary was printed and given to the patient.

## 2019-01-27 ENCOUNTER — Other Ambulatory Visit: Payer: Self-pay | Admitting: Certified Nurse Midwife

## 2019-01-27 DIAGNOSIS — E559 Vitamin D deficiency, unspecified: Secondary | ICD-10-CM

## 2019-01-27 DIAGNOSIS — E7800 Pure hypercholesterolemia, unspecified: Secondary | ICD-10-CM | POA: Diagnosis not present

## 2019-01-27 DIAGNOSIS — E039 Hypothyroidism, unspecified: Secondary | ICD-10-CM | POA: Diagnosis not present

## 2019-01-27 DIAGNOSIS — E109 Type 1 diabetes mellitus without complications: Secondary | ICD-10-CM | POA: Diagnosis not present

## 2019-01-27 LAB — CBC
Hematocrit: 42.7 % (ref 34.0–46.6)
Hemoglobin: 14.2 g/dL (ref 11.1–15.9)
MCH: 31.2 pg (ref 26.6–33.0)
MCHC: 33.3 g/dL (ref 31.5–35.7)
MCV: 94 fL (ref 79–97)
Platelets: 243 10*3/uL (ref 150–450)
RBC: 4.55 x10E6/uL (ref 3.77–5.28)
RDW: 12.3 % (ref 11.7–15.4)
WBC: 8.8 10*3/uL (ref 3.4–10.8)

## 2019-01-27 LAB — VITAMIN D 25 HYDROXY (VIT D DEFICIENCY, FRACTURES): Vit D, 25-Hydroxy: 21.2 ng/mL — ABNORMAL LOW (ref 30.0–100.0)

## 2019-01-28 LAB — CYTOLOGY - PAP
Diagnosis: NEGATIVE
HPV: NOT DETECTED

## 2019-02-05 DIAGNOSIS — E7800 Pure hypercholesterolemia, unspecified: Secondary | ICD-10-CM | POA: Diagnosis not present

## 2019-02-05 DIAGNOSIS — E039 Hypothyroidism, unspecified: Secondary | ICD-10-CM | POA: Diagnosis not present

## 2019-02-05 DIAGNOSIS — E109 Type 1 diabetes mellitus without complications: Secondary | ICD-10-CM | POA: Diagnosis not present

## 2019-02-12 ENCOUNTER — Other Ambulatory Visit: Payer: Self-pay | Admitting: Certified Nurse Midwife

## 2019-02-12 DIAGNOSIS — Z3041 Encounter for surveillance of contraceptive pills: Secondary | ICD-10-CM

## 2019-02-12 NOTE — Telephone Encounter (Signed)
Medication refill request: Junel Last AEX:  01/26/2019 DL Next AEX: 01/31/2020 Last MMG (if hormonal medication request): 12/16/2018 Density C, BIRADS 1 Negative Refill authorized: Today please advise  Medication pended for #28 [phone] refills. Please refill if appropriate.

## 2019-03-05 ENCOUNTER — Other Ambulatory Visit: Payer: Self-pay | Admitting: Certified Nurse Midwife

## 2019-03-05 DIAGNOSIS — B373 Candidiasis of vulva and vagina: Secondary | ICD-10-CM

## 2019-03-05 DIAGNOSIS — B3731 Acute candidiasis of vulva and vagina: Secondary | ICD-10-CM

## 2019-03-05 NOTE — Telephone Encounter (Signed)
Medication refill request: Mycolog Last AEX:  11/06/17 Next AEX: 05/04/19 Last MMG (if hormonal medication request): NA Refill authorized: 30g 0 rf

## 2019-05-04 ENCOUNTER — Other Ambulatory Visit: Payer: Self-pay

## 2019-05-04 ENCOUNTER — Other Ambulatory Visit (INDEPENDENT_AMBULATORY_CARE_PROVIDER_SITE_OTHER): Payer: BC Managed Care – PPO

## 2019-05-04 DIAGNOSIS — E559 Vitamin D deficiency, unspecified: Secondary | ICD-10-CM | POA: Diagnosis not present

## 2019-05-05 ENCOUNTER — Other Ambulatory Visit: Payer: Self-pay | Admitting: Certified Nurse Midwife

## 2019-05-05 DIAGNOSIS — E559 Vitamin D deficiency, unspecified: Secondary | ICD-10-CM

## 2019-05-05 LAB — VITAMIN D 25 HYDROXY (VIT D DEFICIENCY, FRACTURES): Vit D, 25-Hydroxy: 27.2 ng/mL — ABNORMAL LOW (ref 30.0–100.0)

## 2019-05-25 DIAGNOSIS — E039 Hypothyroidism, unspecified: Secondary | ICD-10-CM | POA: Diagnosis not present

## 2019-05-25 DIAGNOSIS — E109 Type 1 diabetes mellitus without complications: Secondary | ICD-10-CM | POA: Diagnosis not present

## 2019-05-25 DIAGNOSIS — R252 Cramp and spasm: Secondary | ICD-10-CM | POA: Diagnosis not present

## 2019-05-25 DIAGNOSIS — E7800 Pure hypercholesterolemia, unspecified: Secondary | ICD-10-CM | POA: Diagnosis not present

## 2019-05-25 DIAGNOSIS — E559 Vitamin D deficiency, unspecified: Secondary | ICD-10-CM | POA: Diagnosis not present

## 2019-05-28 DIAGNOSIS — E109 Type 1 diabetes mellitus without complications: Secondary | ICD-10-CM | POA: Diagnosis not present

## 2019-05-28 DIAGNOSIS — E039 Hypothyroidism, unspecified: Secondary | ICD-10-CM | POA: Diagnosis not present

## 2019-05-28 DIAGNOSIS — E559 Vitamin D deficiency, unspecified: Secondary | ICD-10-CM | POA: Diagnosis not present

## 2019-05-28 DIAGNOSIS — E7800 Pure hypercholesterolemia, unspecified: Secondary | ICD-10-CM | POA: Diagnosis not present

## 2019-08-05 ENCOUNTER — Other Ambulatory Visit: Payer: BC Managed Care – PPO

## 2019-08-20 DIAGNOSIS — Z135 Encounter for screening for eye and ear disorders: Secondary | ICD-10-CM | POA: Diagnosis not present

## 2019-08-20 DIAGNOSIS — E103293 Type 1 diabetes mellitus with mild nonproliferative diabetic retinopathy without macular edema, bilateral: Secondary | ICD-10-CM | POA: Diagnosis not present

## 2019-09-15 ENCOUNTER — Encounter: Payer: Self-pay | Admitting: Certified Nurse Midwife

## 2019-11-16 ENCOUNTER — Other Ambulatory Visit: Payer: Self-pay | Admitting: Obstetrics and Gynecology

## 2019-11-16 DIAGNOSIS — Z1231 Encounter for screening mammogram for malignant neoplasm of breast: Secondary | ICD-10-CM

## 2019-11-19 DIAGNOSIS — E039 Hypothyroidism, unspecified: Secondary | ICD-10-CM | POA: Diagnosis not present

## 2019-11-19 DIAGNOSIS — E109 Type 1 diabetes mellitus without complications: Secondary | ICD-10-CM | POA: Diagnosis not present

## 2019-11-19 DIAGNOSIS — E559 Vitamin D deficiency, unspecified: Secondary | ICD-10-CM | POA: Diagnosis not present

## 2019-11-19 DIAGNOSIS — E7800 Pure hypercholesterolemia, unspecified: Secondary | ICD-10-CM | POA: Diagnosis not present

## 2019-11-26 DIAGNOSIS — E039 Hypothyroidism, unspecified: Secondary | ICD-10-CM | POA: Diagnosis not present

## 2019-11-26 DIAGNOSIS — E559 Vitamin D deficiency, unspecified: Secondary | ICD-10-CM | POA: Diagnosis not present

## 2019-11-26 DIAGNOSIS — E109 Type 1 diabetes mellitus without complications: Secondary | ICD-10-CM | POA: Diagnosis not present

## 2019-11-26 DIAGNOSIS — E7800 Pure hypercholesterolemia, unspecified: Secondary | ICD-10-CM | POA: Diagnosis not present

## 2019-12-17 ENCOUNTER — Ambulatory Visit: Payer: BC Managed Care – PPO

## 2019-12-24 ENCOUNTER — Ambulatory Visit
Admission: RE | Admit: 2019-12-24 | Discharge: 2019-12-24 | Disposition: A | Discharge status: Home / Self Care | Payer: Self-pay | Source: Ambulatory Visit | Attending: Obstetrics and Gynecology | Admitting: Obstetrics and Gynecology

## 2019-12-24 ENCOUNTER — Other Ambulatory Visit: Payer: Self-pay

## 2019-12-24 DIAGNOSIS — Z1231 Encounter for screening mammogram for malignant neoplasm of breast: Secondary | ICD-10-CM

## 2020-01-04 ENCOUNTER — Other Ambulatory Visit: Payer: Self-pay

## 2020-01-04 DIAGNOSIS — Z3041 Encounter for surveillance of contraceptive pills: Secondary | ICD-10-CM

## 2020-01-04 MED ORDER — NORETHIN ACE-ETH ESTRAD-FE 1-20 MG-MCG PO TABS: 1.0000 | ORAL_TABLET | Freq: Every day | ORAL | 2 refills | Status: DC

## 2020-01-04 NOTE — Telephone Encounter (Signed)
Medication refill request: JUNEL FE Last AEX:  01/26/19 DL Next AEX: 02/24/32 JJ Last MMG (if hormonal medication request): 12/24/19 BIRADS 1 negative/density c Refill authorized: Today, please advise.

## 2020-01-31 ENCOUNTER — Ambulatory Visit: Payer: BC Managed Care – PPO | Admitting: Certified Nurse Midwife

## 2020-02-24 NOTE — Progress Notes (Signed)
49 y.o. G0P0000 Single White or Caucasian Not Hispanic or Latino female here for annual exam.  She is on OCP's for cycle control.  Menses q month x 3 days, light with mild cramping. No BTB. Not sexually active in the last year.   She had the [place] [facility] [place] covid vaccination in March. She notices palpitations intermittently in the last few months, feels she is skipping beats. Will f/u with primary.   Dr Talmage Nap manages her DM, last HgbA1C was 7.1.  No H/O HTN. Gets nervous going to MD.  She is on medication for depression and anxiety, doing well.    Patient's last menstrual period was 02/22/2020 (approximate).          Sexually active: No.  The current method of family planning is OCP (estrogen/progesterone).    Exercising: Yes.    Gym/ health club routine includes walking on track . Smoker:  no  Health Maintenance: Pap:  01/26/19 WNL HR HPV Neg, 10-31-15 neg HPV HR neg  History of abnormal Pap:  yes MMG:  12/24/19 density C Bi-rads 1 neg  BMD:   Never  Colonoscopy: Never  TDaP:  2016  Gardasil: NA   reports that she has never smoked. She has never used smokeless tobacco. She reports current alcohol use. She reports that she does not use drugs.1-2 drinks a week. She is Financial trader.   Past Medical History:  Diagnosis Date  . Abnormal Pap smear of cervix   . Anxiety   . Depression   . Diabetes mellitus without complication (HCC)   . HPV (human papilloma virus) infection     Past Surgical History:  Procedure Laterality Date  . COLPOSCOPY  2005    Current Outpatient Medications  Medication Sig Dispense Refill  . B-D UF III MINI PEN NEEDLES 31G X 5 MM MISC USE AS DIRECTED 5 TIMES A DAY  11  . busPIRone (BUSPAR) 15 MG tablet Take 15 mg by mouth 2 (two) times daily.    . DULoxetine (CYMBALTA) 60 MG capsule Take 60 mg by mouth daily.    Marland Kitchen HUMALOG KWIKPEN 100 UNIT/ML KiwkPen   1  . LANTUS SOLOSTAR 100 UNIT/ML Solostar Pen   1  . levothyroxine (SYNTHROID) 25 MCG tablet Take 25  mcg by mouth daily.    . mupirocin ointment (BACTROBAN) 2 % Apply topically.    . norethindrone-ethinyl estradiol (JUNEL FE 1/20) 1-20 MG-MCG tablet Take 1 tablet by mouth daily. 28 tablet 2  . nystatin-triamcinolone ointment (MYCOLOG) APPLY TWICE A DAY AS NEEDED FOR EXTERNAL IRRITATION (DO NOT USE MORE THAN 5 CONSECUTIVE DAYS) 30 g 2  . ONE TOUCH ULTRA TEST test strip      No current facility-administered medications for this visit.    Family History  Problem Relation Age of Onset  . Thyroid disease Mother   . Stroke Maternal Grandmother   . Breast cancer Neg Hx     Review of Systems  All other systems reviewed and are negative.   Exam:   BP (!) 144/80 (BP Location: Left Arm, Patient Position: Sitting, Cuff Size: Normal)   Pulse 68   Resp 12   Ht 5\' 7"  (1.702 m)   Wt 175 lb (79.4 kg)   LMP 02/22/2020 (Approximate)   SpO2 98%   BMI 27.41 kg/m   Weight change: @WEIGHTCHANGE @ Height:   Height: 5\' 7"  (170.2 cm)  Ht Readings from Last 3 Encounters:  03/02/20 5\' 7"  (1.702 m)  01/26/19 5' 6.5" (1.689 m)  11/06/17 5' 6.25" (1.683 m)    General appearance: alert, cooperative and appears stated age Head: Normocephalic, without obvious abnormality, atraumatic Neck: no adenopathy, supple, symmetrical, trachea midline and thyroid normal to inspection and palpation Lungs: clear to auscultation bilaterally Cardiovascular: regular rate and rhythm Breasts: normal appearance, no masses or tenderness Abdomen: soft, non-tender; non distended,  no masses,  no organomegaly Extremities: extremities normal, atraumatic, no cyanosis or edema Skin: Skin color, texture, turgor normal. No rashes or lesions Lymph nodes: Cervical, supraclavicular, and axillary nodes normal. No abnormal inguinal nodes palpated Neurologic: Grossly normal   Pelvic: External genitalia:  no lesions              Urethra:  normal appearing urethra with no masses, tenderness or lesions              Bartholins and  Skenes: normal                 Vagina: normal appearing vagina with normal color and discharge, no lesions              Cervix: no lesions               Bimanual Exam:  Uterus:  normal size, contour, position, consistency, mobility, non-tender              Adnexa: no mass, fullness, tenderness               Rectovaginal: Confirms               Anus:  normal sphincter tone, no lesions  Carolynn Serve chaperoned for the exam.  A:  Well Woman with normal exam  Elevated BP, no h/o HTN. Repeat 142/94  P:   No pap this year  Mammogram UTD  Discussed breast self exam  Discussed calcium and vit D intake  Labs with Dr Talmage Nap  F/U with Dr Talmage Nap for palpitations  Repeat BP still elevated. She will return for BP check next week. Will hold on OCP refill until after that visit

## 2020-03-02 ENCOUNTER — Ambulatory Visit (INDEPENDENT_AMBULATORY_CARE_PROVIDER_SITE_OTHER): Payer: BC Managed Care – PPO | Admitting: Obstetrics and Gynecology

## 2020-03-02 ENCOUNTER — Other Ambulatory Visit: Payer: Self-pay

## 2020-03-02 ENCOUNTER — Encounter: Payer: Self-pay | Admitting: Obstetrics and Gynecology

## 2020-03-02 VITALS — BP 142/94 | HR 68 | Resp 12 | Ht 67.0 in | Wt 175.0 lb

## 2020-03-02 DIAGNOSIS — Z01419 Encounter for gynecological examination (general) (routine) without abnormal findings: Secondary | ICD-10-CM | POA: Diagnosis not present

## 2020-03-02 DIAGNOSIS — Z3041 Encounter for surveillance of contraceptive pills: Secondary | ICD-10-CM

## 2020-03-02 NOTE — Patient Instructions (Signed)
EXERCISE AND DIET:  We recommended that you start or continue a regular exercise program for good health. Regular exercise means any activity that makes your heart beat faster and makes you sweat.  We recommend exercising at least 30 minutes per day at least 3 days a week, preferably 4 or 5.  We also recommend a diet low in fat and sugar.  Inactivity, poor dietary choices and obesity can cause diabetes, heart attack, stroke, and kidney damage, among others.    ALCOHOL AND SMOKING:  Women should limit their alcohol intake to no more than 7 drinks/beers/glasses of wine (combined, not each!) per week. Moderation of alcohol intake to this level decreases your risk of breast cancer and liver damage. And of course, no recreational drugs are part of a healthy lifestyle.  And absolutely no smoking or even second hand smoke. Most people know smoking can cause heart and lung diseases, but did you know it also contributes to weakening of your bones? Aging of your skin?  Yellowing of your teeth and nails?  CALCIUM AND VITAMIN D:  Adequate intake of calcium and Vitamin D are recommended.  The recommendations for exact amounts of these supplements seem to change often, but generally speaking 1,000 mg of calcium (between diet and supplement) and 800 units of Vitamin D per day seems prudent. Certain women may benefit from higher intake of Vitamin D.  If you are among these women, your doctor will have told you during your visit.    PAP SMEARS:  Pap smears, to check for cervical cancer or precancers,  have traditionally been done yearly, although recent scientific advances have shown that most women can have pap smears less often.  However, every woman still should have a physical exam from her gynecologist every year. It will include a breast check, inspection of the vulva and vagina to check for abnormal growths or skin changes, a visual exam of the cervix, and then an exam to evaluate the size and shape of the uterus and  ovaries.  And after 49 years of age, a rectal exam is indicated to check for rectal cancers. We will also provide age appropriate advice regarding health maintenance, like when you should have certain vaccines, screening for sexually transmitted diseases, bone density testing, colonoscopy, mammograms, etc.   MAMMOGRAMS:  All women over 40 years old should have a yearly mammogram. Many facilities now offer a "3D" mammogram, which may cost around $50 extra out of pocket. If possible,  we recommend you accept the option to have the 3D mammogram performed.  It both reduces the number of women who will be called back for extra views which then turn out to be normal, and it is better than the routine mammogram at detecting truly abnormal areas.    COLON CANCER SCREENING: Now recommend starting at age 45. At this time colonoscopy is not covered for routine screening until 50. There are take home tests that can be done between 45-49.   COLONOSCOPY:  Colonoscopy to screen for colon cancer is recommended for all women at age 50.  We know, you hate the idea of the prep.  We agree, BUT, having colon cancer and not knowing it is worse!!  Colon cancer so often starts as a polyp that can be seen and removed at colonscopy, which can quite literally save your life!  And if your first colonoscopy is normal and you have no family history of colon cancer, most women don't have to have it again for   10 years.  Once every ten years, you can do something that may end up saving your life, right?  We will be happy to help you get it scheduled when you are ready.  Be sure to check your insurance coverage so you understand how much it will cost.  It may be covered as a preventative service at no cost, but you should check your particular policy.      Breast Self-Awareness Breast self-awareness means being familiar with how your breasts look and feel. It involves checking your breasts regularly and reporting any changes to your  health care provider. Practicing breast self-awareness is important. A change in your breasts can be a sign of a serious medical problem. Being familiar with how your breasts look and feel allows you to find any problems early, when treatment is more likely to be successful. All women should practice breast self-awareness, including women who have had breast implants. How to do a breast self-exam One way to learn what is normal for your breasts and whether your breasts are changing is to do a breast self-exam. To do a breast self-exam: Look for Changes  1. Remove all the clothing above your waist. 2. Stand in front of a mirror in a room with good lighting. 3. Put your hands on your hips. 4. Push your hands firmly downward. 5. Compare your breasts in the mirror. Look for differences between them (asymmetry), such as: ? Differences in shape. ? Differences in size. ? Puckers, dips, and bumps in one breast and not the other. 6. Look at each breast for changes in your skin, such as: ? Redness. ? Scaly areas. 7. Look for changes in your nipples, such as: ? Discharge. ? Bleeding. ? Dimpling. ? Redness. ? A change in position. Feel for Changes Carefully feel your breasts for lumps and changes. It is best to do this while lying on your back on the floor and again while sitting or standing in the shower or tub with soapy water on your skin. Feel each breast in the following way:  Place the arm on the side of the breast you are examining above your head.  Feel your breast with the other hand.  Start in the nipple area and make  inch (2 cm) overlapping circles to feel your breast. Use the pads of your three middle fingers to do this. Apply light pressure, then medium pressure, then firm pressure. The light pressure will allow you to feel the tissue closest to the skin. The medium pressure will allow you to feel the tissue that is a little deeper. The firm pressure will allow you to feel the tissue  close to the ribs.  Continue the overlapping circles, moving downward over the breast until you feel your ribs below your breast.  Move one finger-width toward the center of the body. Continue to use the  inch (2 cm) overlapping circles to feel your breast as you move slowly up toward your collarbone.  Continue the up and down exam using all three pressures until you reach your armpit.  Write Down What You Find  Write down what is normal for each breast and any changes that you find. Keep a written record with breast changes or normal findings for each breast. By writing this information down, you do not need to depend only on memory for size, tenderness, or location. Write down where you are in your menstrual cycle, if you are still menstruating. If you are having trouble noticing differences   in your breasts, do not get discouraged. With time you will become more familiar with the variations in your breasts and more comfortable with the exam. How often should I examine my breasts? Examine your breasts every month. If you are breastfeeding, the best time to examine your breasts is after a feeding or after using a breast pump. If you menstruate, the best time to examine your breasts is 5-7 days after your period is over. During your period, your breasts are lumpier, and it may be more difficult to notice changes. When should I see my health care provider? See your health care provider if you notice:  A change in shape or size of your breasts or nipples.  A change in the skin of your breast or nipples, such as a reddened or scaly area.  Unusual discharge from your nipples.  A lump or thick area that was not there before.  Pain in your breasts.  Anything that concerns you.  

## 2020-03-07 ENCOUNTER — Telehealth: Payer: Self-pay | Admitting: Obstetrics and Gynecology

## 2020-03-07 NOTE — Telephone Encounter (Signed)
Patient cancelled her bp check. She is fine and blood pressure was high because of anxiety at appointment.

## 2020-03-07 NOTE — Telephone Encounter (Signed)
Left detailed message ok per DPR, for pt to take BP at home or at local pharmacy and call back or send mychart message with BP to document in record. Pt to return call for any questions or concerns. Pt had elevated BP at AEX 03/02/20. Encounter closed.

## 2020-03-10 ENCOUNTER — Ambulatory Visit: Payer: BC Managed Care – PPO

## 2020-03-21 DIAGNOSIS — E109 Type 1 diabetes mellitus without complications: Secondary | ICD-10-CM | POA: Diagnosis not present

## 2020-03-21 DIAGNOSIS — I10 Essential (primary) hypertension: Secondary | ICD-10-CM | POA: Diagnosis not present

## 2020-03-21 DIAGNOSIS — Z23 Encounter for immunization: Secondary | ICD-10-CM | POA: Diagnosis not present

## 2020-03-24 ENCOUNTER — Other Ambulatory Visit: Payer: Self-pay | Admitting: Obstetrics and Gynecology

## 2020-03-24 DIAGNOSIS — Z3041 Encounter for surveillance of contraceptive pills: Secondary | ICD-10-CM

## 2020-04-10 ENCOUNTER — Other Ambulatory Visit: Payer: Self-pay | Admitting: Obstetrics and Gynecology

## 2020-04-10 DIAGNOSIS — Z3041 Encounter for surveillance of contraceptive pills: Secondary | ICD-10-CM

## 2020-04-10 NOTE — Telephone Encounter (Signed)
The patient had an elevated BP at her annual exam. She needs a repeat BP prior to any further OCP's. I don't see a repeat BP in her chart. She was told she cold send a [employer]. Please call the patient and let her know we need to know her BP.

## 2020-04-10 NOTE — Telephone Encounter (Signed)
Patient is returning call.  °

## 2020-04-10 NOTE — Telephone Encounter (Signed)
Spoke back with pt after reviewing with Dr Oscar La. Pt given recommendations per Dr Oscar La for OV or Video visit to discuss change in birth control. Pt states last BP with Dr Talmage Nap was 132/84.  Pt states does not have time in work schedule to discuss this week, but would be ok to start mini pill. Pt states on placebo pills now and will need to start new pack on 04/16/20. Pt sent code and text to set up Mychart.   Routing to Dr Oscar La, please advise. Ok to send in mini pill ?

## 2020-04-10 NOTE — Telephone Encounter (Signed)
Left message for pt to return call to triage RN. 

## 2020-04-10 NOTE — Telephone Encounter (Signed)
Medication refill request: Junel FE 1mg  -  Last AEX:  03/02/20 Next AEX: 03/08/21 Last MMG (if hormonal medication request): 12/24/19  Neg  Refill authorized: 84/0

## 2020-04-10 NOTE — Telephone Encounter (Signed)
Spoke with pt. Pt given update and recommendations per Dr Oscar La. Pt states saw Dr Talmage Nap recently and was placed on BP meds and has follow up on 04/28/20. Pt states needs OCPs refilled, almost out.  Pt states does not know of BP med name and cannot see notes per Dr Talmage Nap.   Pt advised will review with Dr Oscar La and return call. Pt agreeable.   Routing to Dr Oscar La

## 2020-04-14 ENCOUNTER — Telehealth: Payer: Self-pay | Admitting: Obstetrics and Gynecology

## 2020-05-17 DIAGNOSIS — E109 Type 1 diabetes mellitus without complications: Secondary | ICD-10-CM | POA: Diagnosis not present

## 2020-05-17 DIAGNOSIS — E7800 Pure hypercholesterolemia, unspecified: Secondary | ICD-10-CM | POA: Diagnosis not present

## 2020-05-17 DIAGNOSIS — E559 Vitamin D deficiency, unspecified: Secondary | ICD-10-CM | POA: Diagnosis not present

## 2020-05-17 DIAGNOSIS — E039 Hypothyroidism, unspecified: Secondary | ICD-10-CM | POA: Diagnosis not present

## 2020-05-26 DIAGNOSIS — E039 Hypothyroidism, unspecified: Secondary | ICD-10-CM | POA: Diagnosis not present

## 2020-05-26 DIAGNOSIS — E7800 Pure hypercholesterolemia, unspecified: Secondary | ICD-10-CM | POA: Diagnosis not present

## 2020-05-26 DIAGNOSIS — E109 Type 1 diabetes mellitus without complications: Secondary | ICD-10-CM | POA: Diagnosis not present

## 2020-05-26 DIAGNOSIS — E559 Vitamin D deficiency, unspecified: Secondary | ICD-10-CM | POA: Diagnosis not present

## 2020-08-15 IMAGING — MG DIGITAL SCREENING BILATERAL MAMMOGRAM WITH TOMO AND CAD
6 of 10 series · 6 of 30 positions shown · non-contrast
Comparison: Previous exam(s).

CLINICAL DATA: Screening.

EXAM:
DIGITAL SCREENING BILATERAL MAMMOGRAM WITH TOMO AND CAD

[L CC synth-2D]
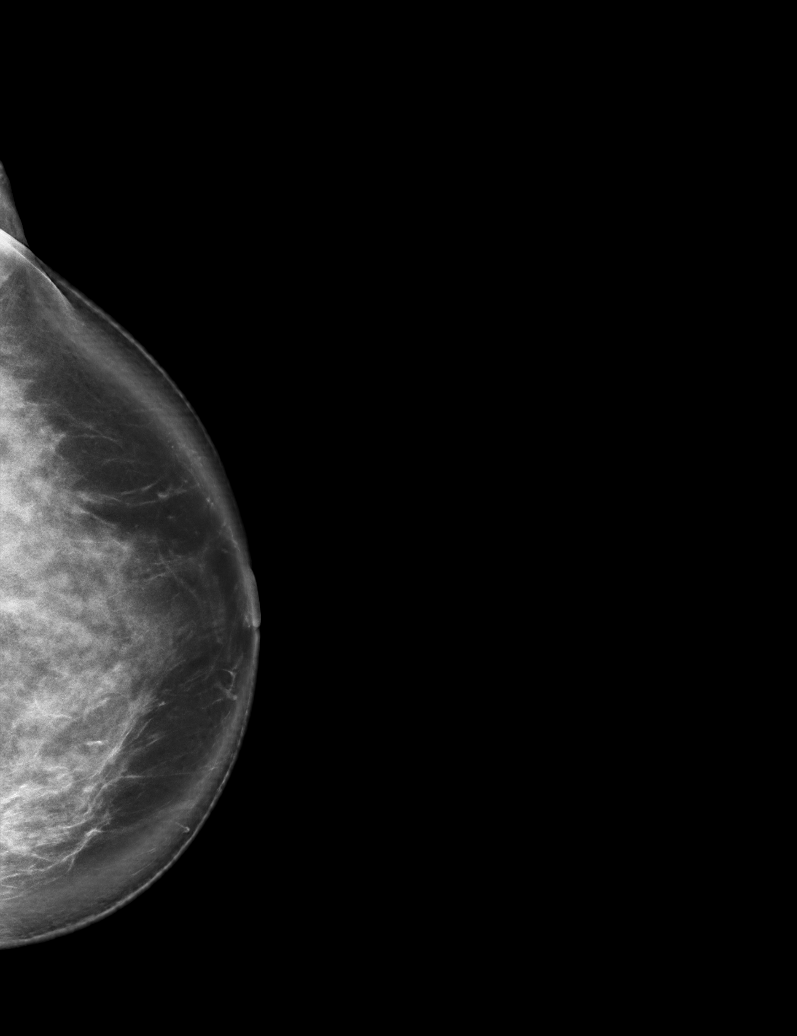

[L MLO synth-2D]
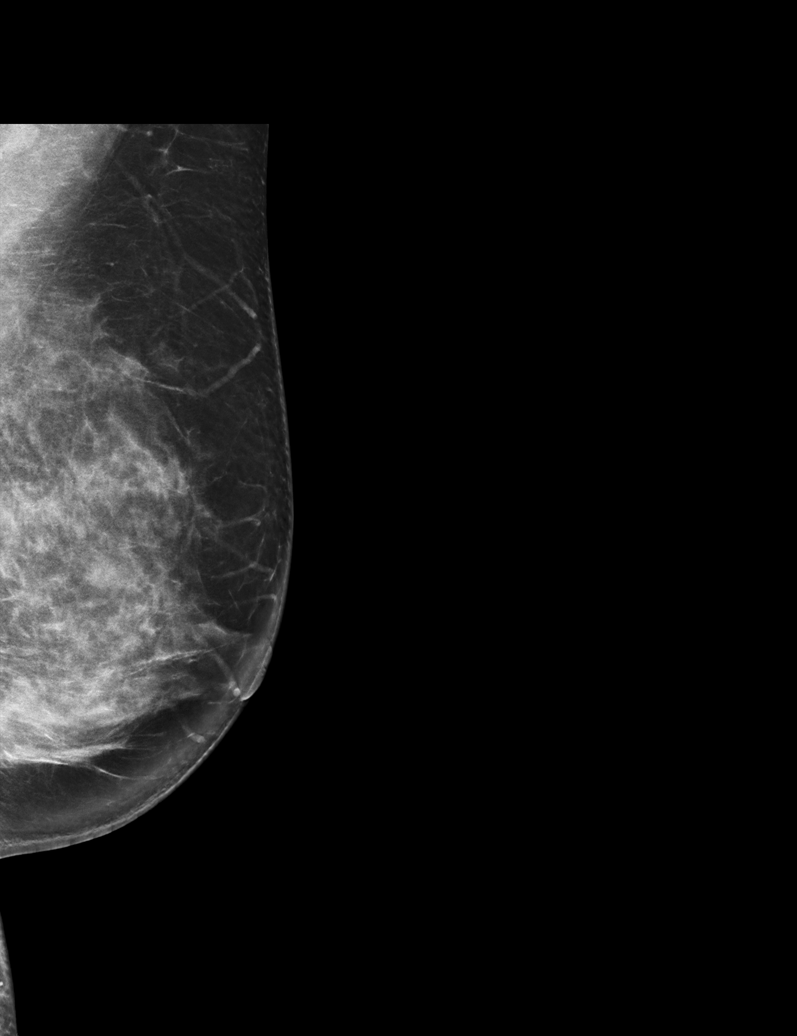

[R XCCL synth-2D]
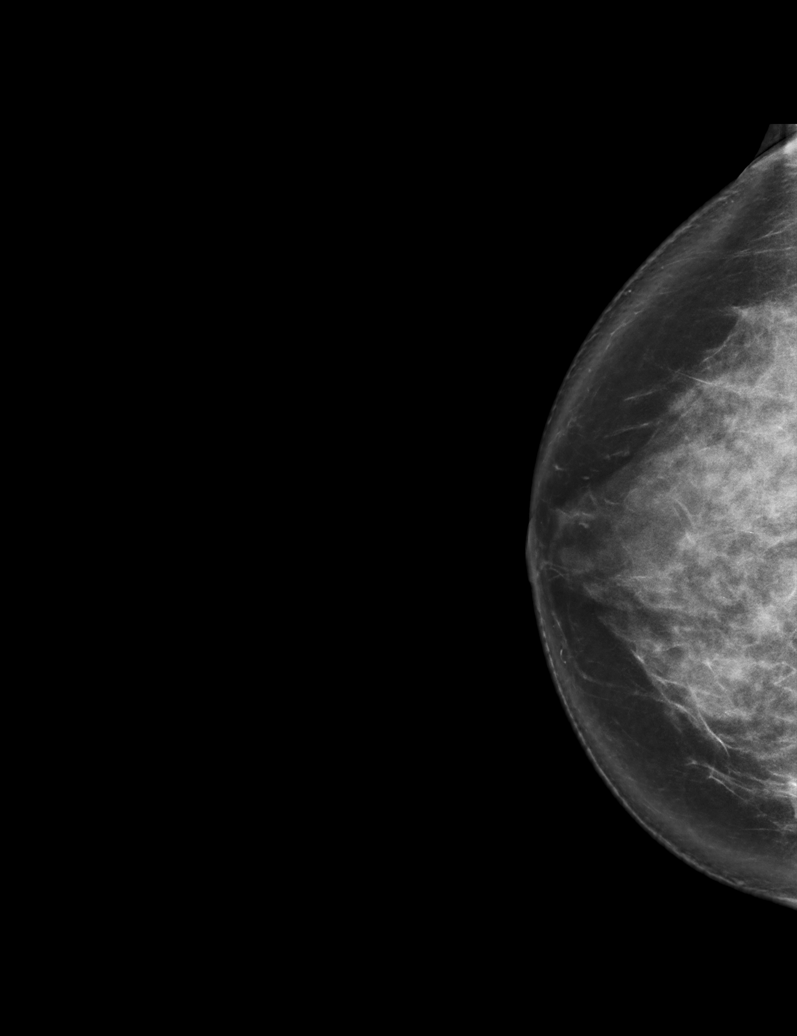

[R MLO synth-2D]
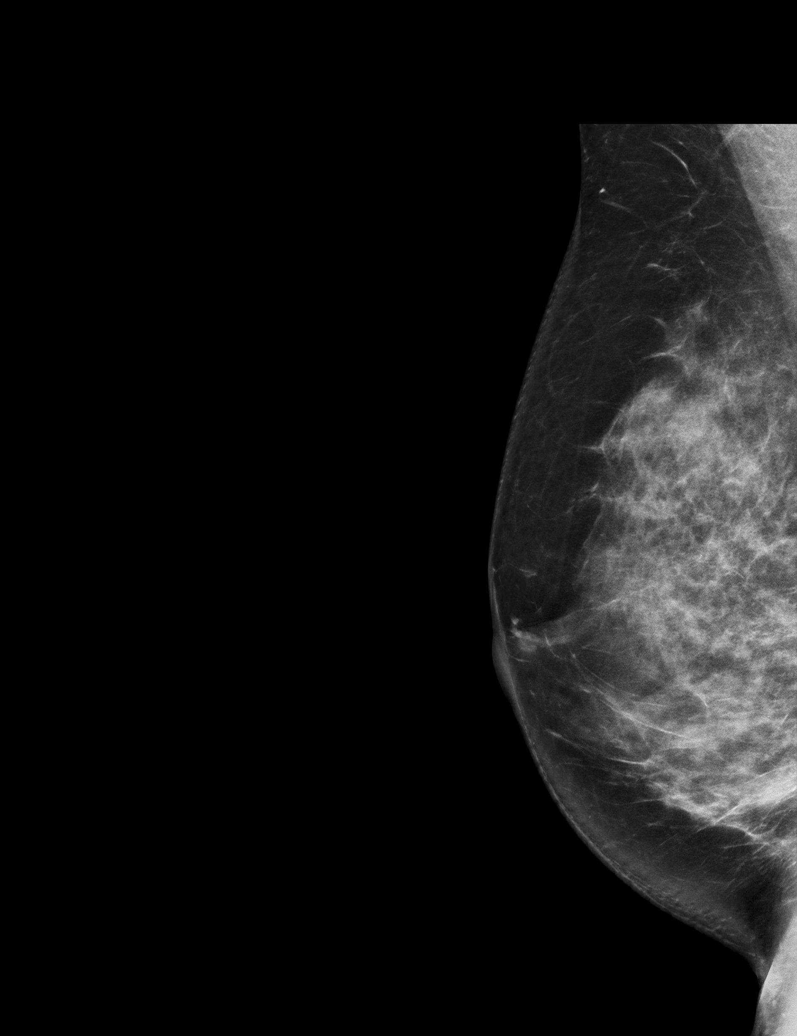

[R CC synth-2D]
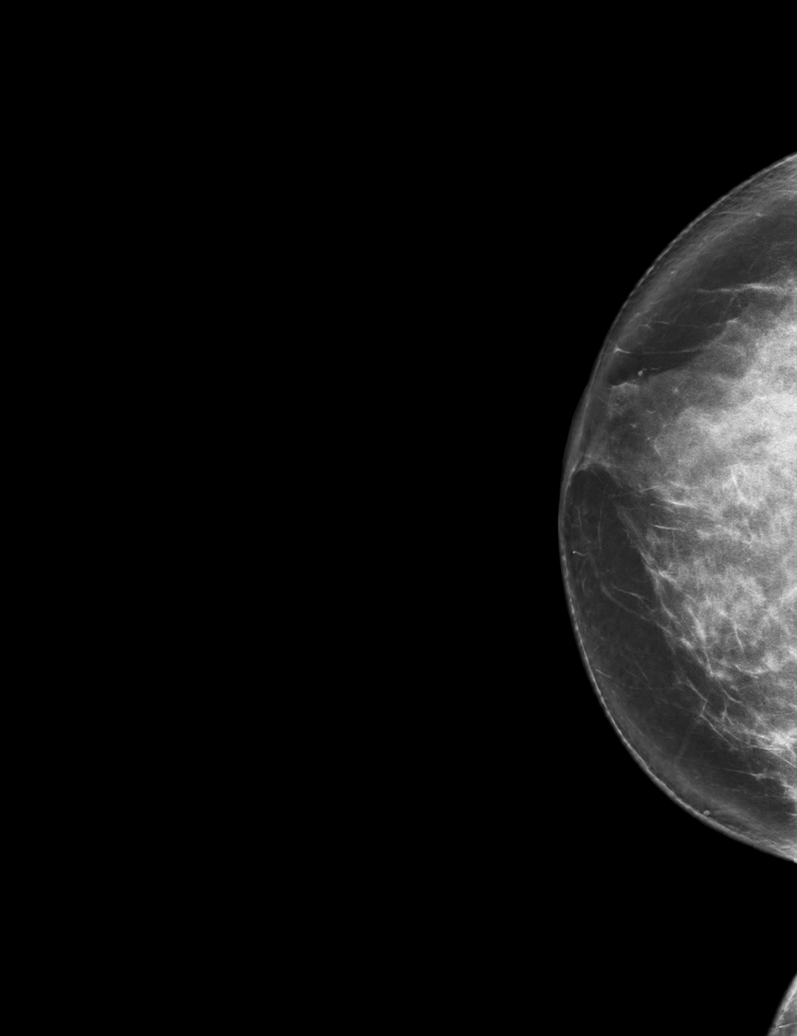

[L CC tomo · tomo slice 53/104.0]
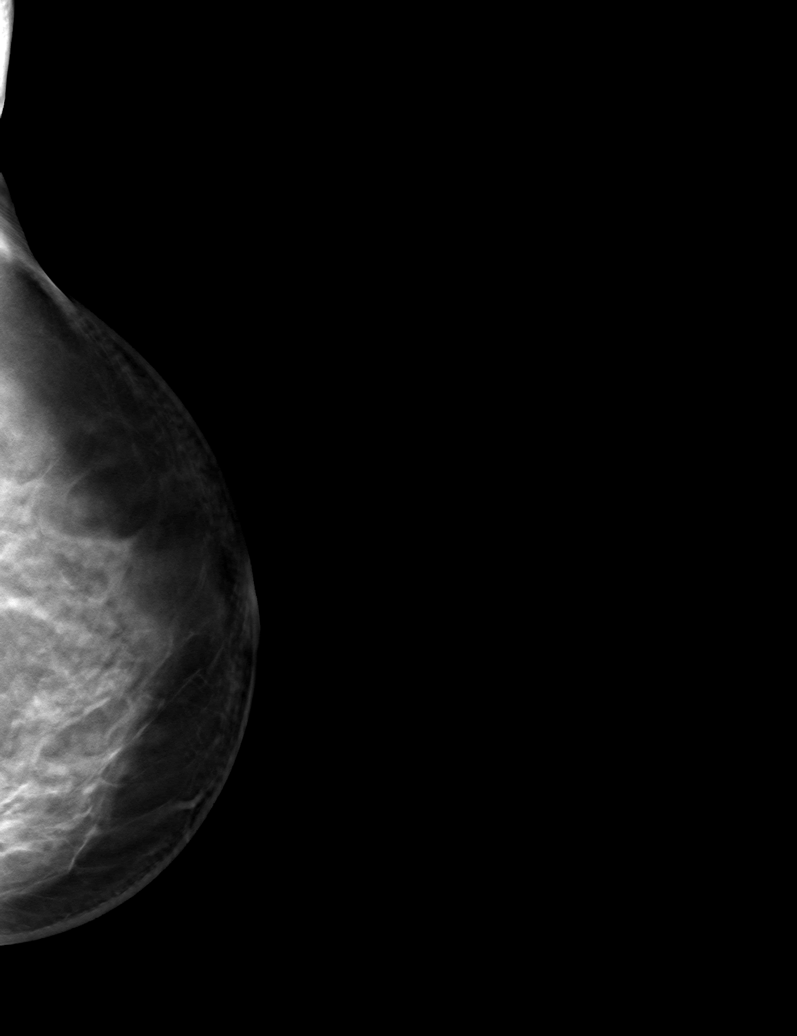

[6 of 30 positions shown; findings below may reference images not displayed]

ACR Breast Density Category c: The breast tissue is heterogeneously
dense, which may obscure small masses.
FINDINGS: There are no findings suspicious for malignancy. Images were
processed with CAD.
IMPRESSION: No mammographic evidence of malignancy. A result letter of this
screening mammogram will be mailed directly to the patient.

RECOMMENDATION:
Screening mammogram in one year. (Code:[id number]-U-LHB)

BI-RADS CATEGORY  1: Negative.

## 2020-12-04 ENCOUNTER — Other Ambulatory Visit: Payer: Self-pay | Admitting: Endocrinology

## 2020-12-04 DIAGNOSIS — Z1231 Encounter for screening mammogram for malignant neoplasm of breast: Secondary | ICD-10-CM

## 2020-12-14 ENCOUNTER — Telehealth: Payer: Self-pay

## 2020-12-14 NOTE — Telephone Encounter (Addendum)
Patient called stating when she came for yearly exam her bcp's were d/c'd due to elevated BP. She said she is tired of bleeding since then and wants bcp Rx. Was adamant that she does not want to come for OV.  03/02/20 note " Repeat BP still elevated. She will return for BP check next week. Will hold on OCP refill until after that visit." Patient scheduled that follow up visit but cancelled it.  She called today stating she wants bcp Rx and "it is not necessary for me to come in."      A later message in My Chart on 04/10/20 patient reported Dr. Talmage Nap had put her on BP medication.  At that time Dr. Oscar La recommended OV or video visit.

## 2020-12-14 NOTE — Telephone Encounter (Signed)
The CDC considers the risk of OCP's to outweigh the benefit when someone has HTN, even when well controlled on medication. There are other options to control her cycle other than OCP's.  I think she needs a visit to find out what is going on with her cycles and to discuss options.

## 2020-12-15 NOTE — Telephone Encounter (Signed)
Call returned to patient.  Left detailed message, ok per dpr.  Advised per Dr. Oscar La.  Advised to return call to office if she would like to schedule an OV or if she has any additional questions.   Encounter closed.

## 2021-01-26 ENCOUNTER — Ambulatory Visit
Admission: RE | Admit: 2021-01-26 | Discharge: 2021-01-26 | Disposition: A | Discharge status: Home / Self Care | Payer: 59 | Source: Ambulatory Visit | Attending: Endocrinology | Admitting: Endocrinology

## 2021-01-26 ENCOUNTER — Other Ambulatory Visit: Payer: Self-pay

## 2021-01-26 DIAGNOSIS — Z1231 Encounter for screening mammogram for malignant neoplasm of breast: Secondary | ICD-10-CM

## 2021-03-08 ENCOUNTER — Ambulatory Visit (INDEPENDENT_AMBULATORY_CARE_PROVIDER_SITE_OTHER): Payer: 59 | Admitting: Nurse Practitioner

## 2021-03-08 ENCOUNTER — Ambulatory Visit: Payer: BC Managed Care – PPO | Admitting: Obstetrics and Gynecology

## 2021-03-08 ENCOUNTER — Encounter: Payer: Self-pay | Admitting: Nurse Practitioner

## 2021-03-08 ENCOUNTER — Other Ambulatory Visit: Payer: Self-pay

## 2021-03-08 VITALS — BP 138/84 | Ht 66.5 in | Wt 170.0 lb

## 2021-03-08 DIAGNOSIS — Z3041 Encounter for surveillance of contraceptive pills: Secondary | ICD-10-CM

## 2021-03-08 DIAGNOSIS — Z01419 Encounter for gynecological examination (general) (routine) without abnormal findings: Secondary | ICD-10-CM | POA: Diagnosis not present

## 2021-03-08 MED ORDER — NORETHINDRONE 0.35 MG PO TABS: 1.0000 | ORAL_TABLET | Freq: Every day | ORAL | 3 refills | Status: DC

## 2021-03-08 NOTE — Progress Notes (Signed)
   Jenny Carter Mar 11, 1971 [id number]   History:  50 y.o. G0 presents for annual exam. OCPs discontinued by endocrinology due to elevated BP. Currently taking progestin-only pill. History of anxiety/depression, DM. Abnormal pap 2005, no intervention required, normal paps since.   Gynecologic History Patient's last menstrual period was 02/19/2021. Period Cycle (Days): 28 Period Duration (Days): 4 Period Pattern: Regular Menstrual Flow: Light, Moderate Dysmenorrhea: (!) Mild Dysmenorrhea Symptoms: Cramping Contraception/Family planning: oral progesterone-only contraceptive Sexually active: Yes  Health Maintenance Last Pap: 01/26/2019. Results were: Normal, 5-year repeat.  Last mammogram: 01/26/2021. Results were: Normal Last colonoscopy: Never Last Dexa: Not indicated  Past medical history, past surgical history, family history and social history were all reviewed and documented in the EPIC chart. Environmental safety at [employer]   ROS:  A ROS was performed and pertinent positives and negatives are included.  Exam:  Vitals:   03/08/21 0933  BP: 138/84  Weight: 170 lb (77.1 kg)  Height: 5' 6.5" (1.689 m)   Body mass index is 27.03 kg/m.  General appearance:  Normal Thyroid:  Symmetrical, normal in size, without palpable masses or nodularity. Respiratory  Auscultation:  Clear without wheezing or rhonchi Cardiovascular  Auscultation:  Regular rate, without rubs, murmurs or gallops  Edema/varicosities:  Not grossly evident Abdominal  Soft,nontender, without masses, guarding or rebound.  Liver/spleen:  No organomegaly noted  Hernia:  None appreciated  Skin  Inspection:  Grossly normal Breasts: Examined lying and sitting.   Right: Without masses, retractions, nipple discharge or axillary adenopathy.   Left: Without masses, retractions, nipple discharge or axillary adenopathy. Genitourinary   Inguinal/mons:  Normal without inguinal adenopathy  External genitalia:   Normal appearing vulva with no masses, tenderness, or lesions  BUS/Urethra/Skene's glands:  Normal  Vagina:  Normal appearing with normal color and discharge, no lesions  Cervix:  Normal appearing without discharge or lesions  Uterus:  Normal in size, shape and contour.  Midline and mobile, nontender  Adnexa/parametria:     Rt: Normal in size, without masses or tenderness.   Lt: Normal in size, without masses or tenderness.  Anus and perineum: Normal  Digital rectal exam: Normal sphincter tone without palpated masses or tenderness  Patient informed chaperone available to be present for breast and pelvic exam. Patient has requested no chaperone to be present. Patient has been advised what will be completed during breast and pelvic exam.   Assessment/Plan:  50 y.o. G0 for annual exam.   Well female exam with routine gynecological exam - Education provided on SBEs, importance of preventative screenings, current guidelines, high calcium diet, regular exercise, and multivitamin daily. Labs with endocrinology.   Encounter for surveillance of contraceptive pills - Plan: norethindrone (MICRONOR) 0.35 MG tablet daily. Taking as prescribed. Has been getting refills through online program and would like them to be managed here. Refills x 1 year provided.   Screening for cervical cancer - Abnormal pap in 2005, no intervention required.  Will repeat at 5-year interval per guidelines.  Screening for breast cancer - Normal mammogram history.  Continue annual screenings.  Normal breast exam today.  Screening for colon cancer - Has not had screening colonoscopy. Discussed current guidelines and recommendation to start screenings now.   Return in 1 year for annual.   Olivia Mackie DNP, 10:00 AM 03/08/2021

## 2021-05-28 ENCOUNTER — Other Ambulatory Visit: Payer: Self-pay | Admitting: Endocrinology

## 2021-05-28 DIAGNOSIS — E7800 Pure hypercholesterolemia, unspecified: Secondary | ICD-10-CM

## 2022-01-16 ENCOUNTER — Other Ambulatory Visit: Payer: Self-pay | Admitting: Nurse Practitioner

## 2022-01-16 ENCOUNTER — Other Ambulatory Visit: Payer: Self-pay | Admitting: Endocrinology

## 2022-01-16 DIAGNOSIS — Z1231 Encounter for screening mammogram for malignant neoplasm of breast: Secondary | ICD-10-CM

## 2022-02-11 ENCOUNTER — Ambulatory Visit
Admission: RE | Admit: 2022-02-11 | Discharge: 2022-02-11 | Disposition: A | Discharge status: Home / Self Care | Payer: 59 | Source: Ambulatory Visit | Attending: Nurse Practitioner | Admitting: Nurse Practitioner

## 2022-02-11 DIAGNOSIS — Z1231 Encounter for screening mammogram for malignant neoplasm of breast: Secondary | ICD-10-CM

## 2022-02-12 ENCOUNTER — Other Ambulatory Visit: Payer: Self-pay | Admitting: Nurse Practitioner

## 2022-02-12 DIAGNOSIS — Z3041 Encounter for surveillance of contraceptive pills: Secondary | ICD-10-CM

## 2022-02-13 NOTE — Telephone Encounter (Signed)
Medication refill request: Micronor  Last AEX:  03/08/21 Next AEX: 03/13/22 Last MMG (if hormonal medication request): 02/11/22 Refill authorized: #[id number] pended for today to get her to her aex

## 2022-03-08 ENCOUNTER — Other Ambulatory Visit: Payer: Self-pay | Admitting: Nurse Practitioner

## 2022-03-08 DIAGNOSIS — Z3041 Encounter for surveillance of contraceptive pills: Secondary | ICD-10-CM

## 2022-03-08 NOTE — Telephone Encounter (Signed)
Last annual exam was 02/2021 Scheduled on 03/13/22

## 2022-03-13 ENCOUNTER — Ambulatory Visit (INDEPENDENT_AMBULATORY_CARE_PROVIDER_SITE_OTHER): Payer: 59 | Admitting: Nurse Practitioner

## 2022-03-13 ENCOUNTER — Encounter: Payer: Self-pay | Admitting: Nurse Practitioner

## 2022-03-13 VITALS — BP 124/86 | HR 89 | Ht 67.0 in | Wt 174.0 lb

## 2022-03-13 DIAGNOSIS — Z01419 Encounter for gynecological examination (general) (routine) without abnormal findings: Secondary | ICD-10-CM | POA: Diagnosis not present

## 2022-03-13 DIAGNOSIS — Z3041 Encounter for surveillance of contraceptive pills: Secondary | ICD-10-CM

## 2022-03-13 DIAGNOSIS — Z1211 Encounter for screening for malignant neoplasm of colon: Secondary | ICD-10-CM

## 2022-03-13 MED ORDER — NORETHINDRONE 0.35 MG PO TABS: 1.0000 | ORAL_TABLET | Freq: Every day | ORAL | 3 refills | Status: DC

## 2022-03-13 NOTE — Progress Notes (Signed)
   Aletha BRIANCA FORTENBERRY 09-16-1970 [id number]   History:  51 y.o. G0 presents for annual exam. Monthly cycles. Switched to POPs d/t elevated BP. Menses are becoming farther apart. Denies menopausal symptoms. History of anxiety/depression. DM managed by endocrinology. Abnormal pap 2005, no intervention required, normal paps since.   Gynecologic History Patient's last menstrual period was 03/07/2022 (exact date). Period Cycle (Days):  (can skip months due to pill) Period Duration (Days): 4 Menstrual Flow:  (light) Menstrual Control: Maxi pad Dysmenorrhea: (!) Moderate Dysmenorrhea Symptoms: Cramping, Diarrhea Contraception/Family planning: oral progesterone-only contraceptive Sexually active: Yes, declines STD screening  Health Maintenance Last Pap: 01/26/2019. Results were: Normal neg HPV  Last mammogram: 02/11/2022. Results were: Normal Last colonoscopy: Never Last Dexa: Not indicated  Past medical history, past surgical history, family history and social history were all reviewed and documented in the EPIC chart. Long-term boyfriend. 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   ROS:  A ROS was performed and pertinent positives and negatives are included.  Exam:  Vitals:   03/13/22 0839  BP: 124/86  Pulse: 89  SpO2: 98%  Weight: 174 lb (78.9 kg)  Height: 5\' 7"  (1.702 m)    Body mass index is 27.25 kg/m.  General appearance:  Normal Thyroid:  Symmetrical, normal in size, without palpable masses or nodularity. Respiratory  Auscultation:  Clear without wheezing or rhonchi Cardiovascular  Auscultation:  Regular rate, without rubs, murmurs or gallops  Edema/varicosities:  Not grossly evident Abdominal  Soft,nontender, without masses, guarding or rebound.  Liver/spleen:  No organomegaly noted  Hernia:  None appreciated  Skin  Inspection:  Grossly normal Breasts: Examined lying and sitting.   Right: Without masses, retractions, nipple discharge or axillary  adenopathy.   Left: Without masses, retractions, nipple discharge or axillary adenopathy. Genitourinary   Inguinal/mons:  Normal without inguinal adenopathy  External genitalia:  Normal appearing vulva with no masses, tenderness, or lesions  BUS/Urethra/Skene's glands:  Normal  Vagina:  Normal appearing with normal color and discharge, no lesions  Cervix:  Normal appearing without discharge or lesions  Uterus:  Normal in size, shape and contour.  Midline and mobile, nontender  Adnexa/parametria:     Rt: Normal in size, without masses or tenderness.   Lt: Normal in size, without masses or tenderness.  Anus and perineum: Normal  Digital rectal exam: Normal sphincter tone without palpated masses or tenderness  Patient informed chaperone available to be present for breast and pelvic exam. Patient has requested no chaperone to be present. Patient has been advised what will be completed during breast and pelvic exam.   Assessment/Plan:  51 y.o. G0 for annual exam.   Well female exam with routine gynecological exam - Education provided on SBEs, importance of preventative screenings, current guidelines, high calcium diet, regular exercise, and multivitamin daily. Labs with endocrinology.   Encounter for surveillance of contraceptive pills - Plan: norethindrone (MICRONOR) 0.35 MG tablet daily. Taking as prescribed.  Refills x 1 year provided.   Screening for cervical cancer - Abnormal pap in 2005, no intervention required.  Will repeat at 5-year interval per guidelines.  Screening for breast cancer - Normal mammogram history.  Continue annual screenings.  Normal breast exam today.  Screening for colon cancer - Plan: Cologuard. Has not had screening colonoscopy. Discussed current guidelines and importance of preventative screenings. Colonoscopy versus Cologuard discussed.   Return in 1 year for annual.     Tamela Gammon DNP, 8:59 AM 03/13/2022

## 2022-07-17 DIAGNOSIS — E7800 Pure hypercholesterolemia, unspecified: Secondary | ICD-10-CM | POA: Diagnosis not present

## 2022-07-17 DIAGNOSIS — E559 Vitamin D deficiency, unspecified: Secondary | ICD-10-CM | POA: Diagnosis not present

## 2022-07-17 DIAGNOSIS — E109 Type 1 diabetes mellitus without complications: Secondary | ICD-10-CM | POA: Diagnosis not present

## 2022-07-17 DIAGNOSIS — Z23 Encounter for immunization: Secondary | ICD-10-CM | POA: Diagnosis not present

## 2022-07-17 DIAGNOSIS — E039 Hypothyroidism, unspecified: Secondary | ICD-10-CM | POA: Diagnosis not present

## 2023-01-10 DIAGNOSIS — E109 Type 1 diabetes mellitus without complications: Secondary | ICD-10-CM | POA: Diagnosis not present

## 2023-01-10 DIAGNOSIS — E7800 Pure hypercholesterolemia, unspecified: Secondary | ICD-10-CM | POA: Diagnosis not present

## 2023-01-10 DIAGNOSIS — E559 Vitamin D deficiency, unspecified: Secondary | ICD-10-CM | POA: Diagnosis not present

## 2023-01-10 DIAGNOSIS — M791 Myalgia, unspecified site: Secondary | ICD-10-CM | POA: Diagnosis not present

## 2023-01-10 DIAGNOSIS — E039 Hypothyroidism, unspecified: Secondary | ICD-10-CM | POA: Diagnosis not present

## 2023-01-13 ENCOUNTER — Other Ambulatory Visit: Payer: Self-pay | Admitting: Nurse Practitioner

## 2023-01-13 DIAGNOSIS — Z1231 Encounter for screening mammogram for malignant neoplasm of breast: Secondary | ICD-10-CM

## 2023-01-17 DIAGNOSIS — E7800 Pure hypercholesterolemia, unspecified: Secondary | ICD-10-CM | POA: Diagnosis not present

## 2023-01-17 DIAGNOSIS — E559 Vitamin D deficiency, unspecified: Secondary | ICD-10-CM | POA: Diagnosis not present

## 2023-01-17 DIAGNOSIS — E039 Hypothyroidism, unspecified: Secondary | ICD-10-CM | POA: Diagnosis not present

## 2023-01-17 DIAGNOSIS — E109 Type 1 diabetes mellitus without complications: Secondary | ICD-10-CM | POA: Diagnosis not present

## 2023-02-11 DIAGNOSIS — R059 Cough, unspecified: Secondary | ICD-10-CM | POA: Diagnosis not present

## 2023-02-14 ENCOUNTER — Ambulatory Visit: Payer: 59

## 2023-03-17 ENCOUNTER — Ambulatory Visit: Payer: 59 | Admitting: Nurse Practitioner

## 2023-03-17 DIAGNOSIS — Z01419 Encounter for gynecological examination (general) (routine) without abnormal findings: Secondary | ICD-10-CM | POA: Diagnosis not present

## 2023-03-17 DIAGNOSIS — Z139 Encounter for screening, unspecified: Secondary | ICD-10-CM | POA: Diagnosis not present

## 2023-03-17 DIAGNOSIS — Z9189 Other specified personal risk factors, not elsewhere classified: Secondary | ICD-10-CM | POA: Diagnosis not present

## 2023-03-17 DIAGNOSIS — Z124 Encounter for screening for malignant neoplasm of cervix: Secondary | ICD-10-CM | POA: Diagnosis not present

## 2023-03-17 DIAGNOSIS — Z1231 Encounter for screening mammogram for malignant neoplasm of breast: Secondary | ICD-10-CM | POA: Diagnosis not present

## 2023-03-17 DIAGNOSIS — N951 Menopausal and female climacteric states: Secondary | ICD-10-CM | POA: Diagnosis not present

## 2023-03-18 ENCOUNTER — Other Ambulatory Visit: Payer: Self-pay | Admitting: Nurse Practitioner

## 2023-03-18 DIAGNOSIS — Z3041 Encounter for surveillance of contraceptive pills: Secondary | ICD-10-CM

## 2023-03-18 NOTE — Telephone Encounter (Signed)
Med refill request: Micronor 0.35mg  Last AEX: 03/13/22 Tiffany Next AEX: none scheduled  Last MMG (if hormonal med) 02/11/22 Refill sent to provider for approval.

## 2023-06-07 ENCOUNTER — Other Ambulatory Visit: Payer: Self-pay | Admitting: Obstetrics and Gynecology

## 2023-06-07 DIAGNOSIS — Z3041 Encounter for surveillance of contraceptive pills: Secondary | ICD-10-CM

## 2023-06-09 NOTE — Telephone Encounter (Signed)
Medication refill request: micronor Last AEX:  03-13-22 Next AEX: none scheduled, appt was scheduled for 03-17-23 it says canceled (provider). Message sent to scheduling department to contact patient Last MMG (if hormonal medication request): 02-11-22 birads 1:neg Refill authorized: please approve or deny as appropriate

## 2023-06-23 ENCOUNTER — Other Ambulatory Visit: Payer: Self-pay | Admitting: Nurse Practitioner

## 2023-06-23 DIAGNOSIS — Z3041 Encounter for surveillance of contraceptive pills: Secondary | ICD-10-CM

## 2023-06-24 NOTE — Telephone Encounter (Signed)
 Med refill request: norethindrone  0.35 mg tab PO Last AEX: 03/13/22 -TW Next AEX: Not scheduled  Last MMG (if hormonal med) 02/11/22- BiRads 1 neg   Rx sent on 06/09/23 #28, with message to schedule AEX  Call placed to patient, left detailed message, ok per dpr. Advised RF sent on 06/09/23 for norethindrone  to [facility], need updated AEX for future refills. Last AEX  03/13/22. Return call to office at [phone], option 1.   Rx refused.    Routing to Provider.   Encounter closed.

## 2023-08-06 DIAGNOSIS — E7800 Pure hypercholesterolemia, unspecified: Secondary | ICD-10-CM | POA: Diagnosis not present

## 2023-08-06 DIAGNOSIS — E039 Hypothyroidism, unspecified: Secondary | ICD-10-CM | POA: Diagnosis not present

## 2023-08-06 DIAGNOSIS — E109 Type 1 diabetes mellitus without complications: Secondary | ICD-10-CM | POA: Diagnosis not present

## 2023-08-06 DIAGNOSIS — E559 Vitamin D deficiency, unspecified: Secondary | ICD-10-CM | POA: Diagnosis not present

## 2023-08-13 ENCOUNTER — Other Ambulatory Visit (HOSPITAL_BASED_OUTPATIENT_CLINIC_OR_DEPARTMENT_OTHER): Payer: Self-pay | Admitting: Endocrinology

## 2023-08-13 DIAGNOSIS — E109 Type 1 diabetes mellitus without complications: Secondary | ICD-10-CM | POA: Diagnosis not present

## 2023-08-13 DIAGNOSIS — E559 Vitamin D deficiency, unspecified: Secondary | ICD-10-CM | POA: Diagnosis not present

## 2023-08-13 DIAGNOSIS — E039 Hypothyroidism, unspecified: Secondary | ICD-10-CM | POA: Diagnosis not present

## 2023-08-13 DIAGNOSIS — E7800 Pure hypercholesterolemia, unspecified: Secondary | ICD-10-CM | POA: Diagnosis not present

## 2023-08-22 ENCOUNTER — Ambulatory Visit: Payer: 59 | Admitting: Cardiovascular Disease

## 2023-09-15 ENCOUNTER — Other Ambulatory Visit (HOSPITAL_BASED_OUTPATIENT_CLINIC_OR_DEPARTMENT_OTHER): Payer: Self-pay

## 2023-10-23 ENCOUNTER — Ambulatory Visit (HOSPITAL_BASED_OUTPATIENT_CLINIC_OR_DEPARTMENT_OTHER)
Admission: RE | Admit: 2023-10-23 | Discharge: 2023-10-23 | Disposition: A | Discharge status: Home / Self Care | Payer: Self-pay | Source: Ambulatory Visit | Attending: Endocrinology | Admitting: Endocrinology

## 2023-10-23 DIAGNOSIS — E109 Type 1 diabetes mellitus without complications: Secondary | ICD-10-CM | POA: Insufficient documentation

## 2023-12-29 ENCOUNTER — Other Ambulatory Visit: Payer: Self-pay | Admitting: Obstetrics and Gynecology

## 2023-12-29 DIAGNOSIS — Z1231 Encounter for screening mammogram for malignant neoplasm of breast: Secondary | ICD-10-CM

## 2024-02-17 DIAGNOSIS — E039 Hypothyroidism, unspecified: Secondary | ICD-10-CM | POA: Diagnosis not present

## 2024-02-17 DIAGNOSIS — E7800 Pure hypercholesterolemia, unspecified: Secondary | ICD-10-CM | POA: Diagnosis not present

## 2024-02-17 DIAGNOSIS — E109 Type 1 diabetes mellitus without complications: Secondary | ICD-10-CM | POA: Diagnosis not present

## 2024-02-17 DIAGNOSIS — E559 Vitamin D deficiency, unspecified: Secondary | ICD-10-CM | POA: Diagnosis not present

## 2024-02-24 DIAGNOSIS — E7800 Pure hypercholesterolemia, unspecified: Secondary | ICD-10-CM | POA: Diagnosis not present

## 2024-02-24 DIAGNOSIS — E109 Type 1 diabetes mellitus without complications: Secondary | ICD-10-CM | POA: Diagnosis not present

## 2024-02-24 DIAGNOSIS — E039 Hypothyroidism, unspecified: Secondary | ICD-10-CM | POA: Diagnosis not present

## 2024-02-24 DIAGNOSIS — E559 Vitamin D deficiency, unspecified: Secondary | ICD-10-CM | POA: Diagnosis not present

## 2024-03-22 ENCOUNTER — Ambulatory Visit: Payer: Self-pay

## 2024-04-05 ENCOUNTER — Ambulatory Visit
Admission: RE | Admit: 2024-04-05 | Discharge: 2024-04-05 | Disposition: A | Discharge status: Home / Self Care | Source: Ambulatory Visit | Attending: Obstetrics and Gynecology | Admitting: Obstetrics and Gynecology

## 2024-04-05 DIAGNOSIS — Z1231 Encounter for screening mammogram for malignant neoplasm of breast: Secondary | ICD-10-CM

## 2024-04-15 DIAGNOSIS — Z01419 Encounter for gynecological examination (general) (routine) without abnormal findings: Secondary | ICD-10-CM | POA: Diagnosis not present

## 2024-04-15 DIAGNOSIS — N951 Menopausal and female climacteric states: Secondary | ICD-10-CM | POA: Diagnosis not present

## 2024-04-15 DIAGNOSIS — Z9189 Other specified personal risk factors, not elsewhere classified: Secondary | ICD-10-CM | POA: Diagnosis not present

## 2024-04-15 DIAGNOSIS — Z1330 Encounter for screening examination for mental health and behavioral disorders, unspecified: Secondary | ICD-10-CM | POA: Diagnosis not present

## 2024-06-08 DIAGNOSIS — N951 Menopausal and female climacteric states: Secondary | ICD-10-CM | POA: Diagnosis not present
